# Patient Record
Sex: Female | Born: 1935 | Smoking: Never smoker
Health system: Southern US, Community
[De-identification: ages and names within clinical notes are randomized; demographics above are authoritative.]

## PROBLEM LIST (undated history)

## (undated) DIAGNOSIS — M1990 Unspecified osteoarthritis, unspecified site: Secondary | ICD-10-CM

## (undated) DIAGNOSIS — I10 Essential (primary) hypertension: Secondary | ICD-10-CM

---

## 1997-10-24 ENCOUNTER — Ambulatory Visit (HOSPITAL_COMMUNITY): Admission: RE | Admit: 1997-10-24 | Discharge: 1997-10-24 | Payer: Self-pay | Admitting: Family Medicine

## 1997-11-02 ENCOUNTER — Ambulatory Visit (HOSPITAL_COMMUNITY): Admission: RE | Admit: 1997-11-02 | Discharge: 1997-11-02 | Payer: Self-pay | Admitting: Family Medicine

## 2001-06-27 ENCOUNTER — Encounter: Payer: Self-pay | Admitting: Internal Medicine

## 2001-06-27 ENCOUNTER — Encounter: Admission: RE | Admit: 2001-06-27 | Discharge: 2001-06-27 | Payer: Self-pay | Admitting: Internal Medicine

## 2002-06-15 ENCOUNTER — Encounter: Payer: Self-pay | Admitting: Internal Medicine

## 2002-06-15 ENCOUNTER — Encounter: Admission: RE | Admit: 2002-06-15 | Discharge: 2002-06-15 | Payer: Self-pay | Admitting: Internal Medicine

## 2003-10-30 ENCOUNTER — Encounter: Admission: RE | Admit: 2003-10-30 | Discharge: 2003-10-30 | Payer: Self-pay | Admitting: Internal Medicine

## 2017-01-30 ENCOUNTER — Encounter (HOSPITAL_COMMUNITY): Payer: Self-pay

## 2017-01-30 ENCOUNTER — Inpatient Hospital Stay (HOSPITAL_COMMUNITY)
Admission: EM | Admit: 2017-01-30 | Discharge: 2017-02-01 | DRG: 563 | Disposition: A | Discharge status: Skilled Nursing Facility | Payer: Medicare Other | Attending: Internal Medicine | Admitting: Internal Medicine

## 2017-01-30 ENCOUNTER — Emergency Department (HOSPITAL_COMMUNITY): Payer: Medicare Other

## 2017-01-30 DIAGNOSIS — W182XXA Fall in (into) shower or empty bathtub, initial encounter: Secondary | ICD-10-CM | POA: Diagnosis present

## 2017-01-30 DIAGNOSIS — E872 Acidosis, unspecified: Secondary | ICD-10-CM

## 2017-01-30 DIAGNOSIS — S82101A Unspecified fracture of upper end of right tibia, initial encounter for closed fracture: Secondary | ICD-10-CM | POA: Diagnosis not present

## 2017-01-30 DIAGNOSIS — Y92031 Bathroom in apartment as the place of occurrence of the external cause: Secondary | ICD-10-CM

## 2017-01-30 DIAGNOSIS — S82192A Other fracture of upper end of left tibia, initial encounter for closed fracture: Secondary | ICD-10-CM

## 2017-01-30 DIAGNOSIS — N179 Acute kidney failure, unspecified: Secondary | ICD-10-CM | POA: Diagnosis not present

## 2017-01-30 DIAGNOSIS — S82209A Unspecified fracture of shaft of unspecified tibia, initial encounter for closed fracture: Secondary | ICD-10-CM | POA: Diagnosis present

## 2017-01-30 DIAGNOSIS — S8991XA Unspecified injury of right lower leg, initial encounter: Secondary | ICD-10-CM | POA: Diagnosis not present

## 2017-01-30 DIAGNOSIS — I10 Essential (primary) hypertension: Secondary | ICD-10-CM | POA: Diagnosis present

## 2017-01-30 DIAGNOSIS — Z8249 Family history of ischemic heart disease and other diseases of the circulatory system: Secondary | ICD-10-CM

## 2017-01-30 DIAGNOSIS — Y93E1 Activity, personal bathing and showering: Secondary | ICD-10-CM

## 2017-01-30 DIAGNOSIS — S82102A Unspecified fracture of upper end of left tibia, initial encounter for closed fracture: Secondary | ICD-10-CM | POA: Diagnosis not present

## 2017-01-30 DIAGNOSIS — Z66 Do not resuscitate: Secondary | ICD-10-CM | POA: Diagnosis present

## 2017-01-30 HISTORY — DX: Essential (primary) hypertension: I10

## 2017-01-30 LAB — CBC
HCT: 27.4 % — ABNORMAL LOW (ref 36.0–46.0)
Hemoglobin: 9.4 g/dL — ABNORMAL LOW (ref 12.0–15.0)
MCH: 29.1 pg (ref 26.0–34.0)
MCHC: 34.3 g/dL (ref 30.0–36.0)
MCV: 84.8 fL (ref 78.0–100.0)
PLATELETS: 143 10*3/uL — AB (ref 150–400)
RBC: 3.23 MIL/uL — ABNORMAL LOW (ref 3.87–5.11)
RDW: 16.1 % — AB (ref 11.5–15.5)
WBC: 4.5 10*3/uL (ref 4.0–10.5)

## 2017-01-30 LAB — CBC WITH DIFFERENTIAL/PLATELET
BASOS PCT: 0 %
Basophils Absolute: 0 10*3/uL (ref 0.0–0.1)
EOS ABS: 0 10*3/uL (ref 0.0–0.7)
Eosinophils Relative: 0 %
HCT: 26.3 % — ABNORMAL LOW (ref 36.0–46.0)
HEMOGLOBIN: 8.8 g/dL — AB (ref 12.0–15.0)
Lymphocytes Relative: 9 %
Lymphs Abs: 0.5 10*3/uL — ABNORMAL LOW (ref 0.7–4.0)
MCH: 28.6 pg (ref 26.0–34.0)
MCHC: 33.5 g/dL (ref 30.0–36.0)
MCV: 85.4 fL (ref 78.0–100.0)
MONOS PCT: 6 %
Monocytes Absolute: 0.3 10*3/uL (ref 0.1–1.0)
NEUTROS PCT: 85 %
Neutro Abs: 4.6 10*3/uL (ref 1.7–7.7)
Platelets: 146 10*3/uL — ABNORMAL LOW (ref 150–400)
RBC: 3.08 MIL/uL — AB (ref 3.87–5.11)
RDW: 16.2 % — ABNORMAL HIGH (ref 11.5–15.5)
WBC: 5.5 10*3/uL (ref 4.0–10.5)

## 2017-01-30 LAB — BASIC METABOLIC PANEL
Anion gap: 11 (ref 5–15)
BUN: 39 mg/dL — AB (ref 6–20)
CO2: 17 mmol/L — ABNORMAL LOW (ref 22–32)
CREATININE: 1.42 mg/dL — AB (ref 0.44–1.00)
Calcium: 8.7 mg/dL — ABNORMAL LOW (ref 8.9–10.3)
Chloride: 108 mmol/L (ref 101–111)
GFR, EST AFRICAN AMERICAN: 35 mL/min — AB (ref >60)
GFR, EST NON AFRICAN AMERICAN: 30 mL/min — AB (ref >60)
Glucose, Bld: 138 mg/dL — ABNORMAL HIGH (ref 65–99)
Potassium: 3.8 mmol/L (ref 3.5–5.1)
SODIUM: 136 mmol/L (ref 135–145)

## 2017-01-30 LAB — CREATININE, SERUM
Creatinine, Ser: 1.5 mg/dL — ABNORMAL HIGH (ref 0.44–1.00)
GFR, EST AFRICAN AMERICAN: 33 mL/min — AB (ref >60)
GFR, EST NON AFRICAN AMERICAN: 28 mL/min — AB (ref >60)

## 2017-01-30 MED ORDER — ACETAMINOPHEN 325 MG PO TABS
650.0000 mg | ORAL_TABLET | Freq: Four times a day (QID) | ORAL | Status: DC | PRN
  Administered 2017-01-31: 650 mg via ORAL
  Filled 2017-01-30: qty 2

## 2017-01-30 MED ORDER — ONDANSETRON HCL 4 MG/2ML IJ SOLN
4.0000 mg | Freq: Four times a day (QID) | INTRAMUSCULAR | Status: DC | PRN
Start: 2017-01-30 — End: 2017-02-01

## 2017-01-30 MED ORDER — ONDANSETRON HCL 4 MG PO TABS: 4.0000 mg | ORAL_TABLET | Freq: Four times a day (QID) | ORAL | Status: DC | PRN

## 2017-01-30 MED ORDER — HYDROCODONE-ACETAMINOPHEN 5-325 MG PO TABS
1.0000 | ORAL_TABLET | ORAL | Status: DC | PRN
  Administered 2017-01-30: 21:00:00 1 via ORAL
  Administered 2017-01-31 (×2): 2 via ORAL
  Filled 2017-01-30: qty 2
  Filled 2017-01-30: qty 1
  Filled 2017-01-30: qty 2

## 2017-01-30 MED ORDER — ACETAMINOPHEN 650 MG RE SUPP: 650.0000 mg | Freq: Four times a day (QID) | RECTAL | Status: DC | PRN

## 2017-01-30 MED ORDER — KETOROLAC TROMETHAMINE 15 MG/ML IJ SOLN
15.0000 mg | Freq: Four times a day (QID) | INTRAMUSCULAR | Status: DC | PRN
  Administered 2017-01-31: 15 mg via INTRAVENOUS
  Filled 2017-01-30: qty 1

## 2017-01-30 MED ORDER — SODIUM CHLORIDE 0.9 % IV SOLN
INTRAVENOUS | Status: DC
  Administered 2017-01-30: 17:00:00 via INTRAVENOUS

## 2017-01-30 MED ORDER — SODIUM CHLORIDE 0.9 % IV SOLN
INTRAVENOUS | Status: AC
  Administered 2017-01-30 – 2017-01-31 (×2): via INTRAVENOUS

## 2017-01-30 MED ORDER — POLYETHYLENE GLYCOL 3350 17 G PO PACK
17.0000 g | PACK | Freq: Every day | ORAL | Status: DC
  Administered 2017-01-31: 17 g via ORAL
  Filled 2017-01-30: qty 1

## 2017-01-30 MED ORDER — HEPARIN SODIUM (PORCINE) 5000 UNIT/ML IJ SOLN
5000.0000 [IU] | Freq: Three times a day (TID) | INTRAMUSCULAR | Status: DC
  Administered 2017-01-30 – 2017-02-01 (×6): 5000 [IU] via SUBCUTANEOUS
  Filled 2017-01-30 (×6): qty 1

## 2017-01-30 NOTE — H&P (Signed)
History and Physical    Sharon Ryan:[id number] DOB: [birth year] DOA: 01/30/2017  PCP: Ralene Ok, MD  Patient coming from: Home  I have personally briefly reviewed patient's old medical records in [facility]  Chief Complaint: Mechanical fall with right knee pain  HPI: Sharon Ryan is a 83 y.o. female with medical history significant of no significant past medical history lives alone for the last several years that comes in after mechanical fall that happened when she was getting into the bathtub. She relates she did not lose consciousness, no blurry vision no dizziness, no chest pain, no shortness of breath, no diarrhea, no fever.  ED Course:  She was found to have a creatinine of 1.4 bicarbonate of 17 hemoglobin of 8.8 with a platelet count of 146 and an MCV of 85 were consulted for further evaluation and possible skilled nursing facility placement.  Review of Systems: As per HPI otherwise 10 point review of systems negative.    Past Medical History:  Diagnosis Date  . Hypertension     History reviewed. No pertinent surgical history.   does not have a smoking history on file. She has never used smokeless tobacco. She reports that she does not drink alcohol or use drugs.  Not on File  Family History  Problem Relation Age of Onset  . Heart attack Father      Prior to Admission medications   Medication Sig Start Date End Date Taking? Authorizing Provider  PRESCRIPTION MEDICATION Take 1 tablet by mouth daily.   Yes [provider]    Physical Exam: Vitals:   01/30/17 1413  BP: (!) 157/76  Pulse: 93  Resp: 16  Temp: 97.9 F (36.6 C)  TempSrc: Oral  SpO2: 95%    Constitutional: NAD, calm, comfortable Vitals:   01/30/17 1413  BP: (!) 157/76  Pulse: 93  Resp: 16  Temp: 97.9 F (36.6 C)  TempSrc: Oral  SpO2: 95%   Eyes: PERRL, lids and conjunctivae normal ENMT: Mucous membranes are moist. Posterior pharynx clear of any  exudate or lesions.Normal dentition.  Neck: normal, supple, no masses, no thyromegaly Respiratory: clear to auscultation bilaterally, no wheezing, no crackles. Normal respiratory effort. No accessory muscle use.  Cardiovascular: Regular rate and rhythm, no murmurs / rubs / gallops. No extremity edema. 2+ pedal pulses. No carotid bruits.  Abdomen: no tenderness, no masses palpated. No hepatosplenomegaly. Bowel sounds positive.  Musculoskeletal: no clubbing / cyanosis. No joint deformity upper and lower extremities. Good ROM, no contractures. Normal muscle tone.  Skin: no rashes, lesions, ulcers. No induration Neurologic: CN 2-12 grossly intact. Sensation intact, DTR normal. Strength 5/5 in all 4.  Psychiatric: Normal judgment and insight. Alert and oriented x 3. Normal mood.     Labs on Admission: I have personally reviewed following labs and imaging studies  CBC:  Recent Labs Lab 01/30/17 1540  WBC 5.5  NEUTROABS 4.6  HGB 8.8*  HCT 26.3*  MCV 85.4  PLT 146*   Basic Metabolic Panel:  Recent Labs Lab 01/30/17 1540  NA 136  K 3.8  CL 108  CO2 17*  GLUCOSE 138*  BUN 39*  CREATININE 1.42*  CALCIUM 8.7*   GFR: CrCl cannot be calculated (Unknown ideal weight.). Liver Function Tests: No results for input(s): AST, ALT, ALKPHOS, BILITOT, PROT, ALBUMIN in the last 168 hours. No results for input(s): LIPASE, AMYLASE in the last 168 hours. No results for input(s): AMMONIA in the last 168 hours. Coagulation Profile: No results  for input(s): INR, PROTIME in the last 168 hours. Cardiac Enzymes: No results for input(s): CKTOTAL, CKMB, CKMBINDEX, TROPONINI in the last 168 hours. BNP (last 3 results) No results for input(s): PROBNP in the last 8760 hours. HbA1C: No results for input(s): HGBA1C in the last 72 hours. CBG: No results for input(s): GLUCAP in the last 168 hours. Lipid Profile: No results for input(s): CHOL, HDL, LDLCALC, TRIG, CHOLHDL, LDLDIRECT in the last 72  hours. Thyroid Function Tests: No results for input(s): TSH, T4TOTAL, FREET4, T3FREE, THYROIDAB in the last 72 hours. Anemia Panel: No results for input(s): VITAMINB12, FOLATE, FERRITIN, TIBC, IRON, RETICCTPCT in the last 72 hours. Urine analysis: No results found for: COLORURINE, APPEARANCEUR, LABSPEC, PHURINE, GLUCOSEU, HGBUR, BILIRUBINUR, KETONESUR, PROTEINUR, UROBILINOGEN, NITRITE, LEUKOCYTESUR  Radiological Exams on Admission: Dg Tibia/fibula Right  Result Date: 01/30/2017 CLINICAL DATA:  Fall in shower today with right lower leg pain. EXAM: RIGHT TIBIA AND FIBULA - 2 VIEW COMPARISON:  None. FINDINGS: Diffuse osteopenia. Evidence of patient's minimally displaced transverse fracture of the proximal tibial metaphysis. Degenerative changes over the knee. Knee joint effusion. Mild degenerative change over the ankle. IMPRESSION: Minimally displaced transverse fracture of the proximal tibial metaphysis. Degenerative changes of the knee and ankle.  Knee joint effusion. Electronically Signed   By: Elberta Fortisaniel  Boyle M.D.   On: 01/30/2017 15:34   Dg Knee Complete 4 Views Right  Result Date: 01/30/2017 CLINICAL DATA:  Fall in shower today with right knee pain as well as lower leg pain. EXAM: RIGHT KNEE - COMPLETE 4+ VIEW COMPARISON:  None. FINDINGS: Exam demonstrates mild tricompartmental osteoarthritic change. There is diffuse osteopenia. There is a minimally displaced transverse fracture of the proximal tibial metaphysis. Moderate-sized joint effusion. IMPRESSION: Minimally displaced transverse fracture of the proximal tibial metaphysis. Moderate joint effusion. Mild tricompartmental osteoarthritis. Electronically Signed   By: Elberta Fortisaniel  Boyle M.D.   On: 01/30/2017 15:32    EKG: Independently reviewed. none  Assessment/Plan Active Problems:   Closed fracture of left proximal tibia   AKI (acute kidney injury) (HCC)   Normal anion gap metabolic acidosis As per patient she takes no medication. The ED  physician has already talked to Dr. Vallery SaSwyntek which will see her tomorrow morning, he recommended nonweightbearing in a knee brace. We'll admitted to the hospital control her pain, with narcotics. Use MiraLAX for constipation. We'll get physical therapy. Unknown baseline creatinine will start her on gentle IV fluid hydration and recheck a basic metabolic panel in the morning. She denies any diarrhea or any vomiting, unclear etiology of her non-anion gap metabolic acidosis will hydrate her with IV fluids and recheck tomorrow morning.   DVT prophylaxis: Lovenox Code Status: DNR Family Communication: none, son lives in [place] Disposition Plan:  Consults called: Orthopedic  Admission status: observation   Marinda ElkFELIZ ORTIZ, ABRAHAM MD [facility] Pager [phone]  If 7PM-7AM, please contact night-coverage [email or web address] Password [email or web address]  01/30/2017, 6:24 PM

## 2017-01-30 NOTE — ED Notes (Signed)
. ED TO INPATIENT HANDOFF REPORT  Name/Age/Gender Sharon Ryan 83 y.o. female  Code Status Code Status History    This patient does not have a recorded code status. Please follow your organizational policy for patients in this situation.      Home/SNF/Other    Chief Complaint Fall  Level of Care/Admitting Diagnosis ED Disposition    ED Disposition Condition Comment   Admit  Hospital Area: [facility] [[id number]]  Level of Care: Med-Surg [16]  Diagnosis: Tibial fracture [270350]  Admitting Physician: Charlynne Cousins [[id number]]  Attending Physician: Charlynne Cousins [[id number]]  PT Class (Do Not Modify): Observation [104]  PT Acc Code (Do Not Modify): Observation [10022]       Medical History Past Medical History:  Diagnosis Date  . Hypertension     Allergies Not on File  IV Location/Drains/Wounds Patient Lines/Drains/Airways Status   Active Line/Drains/Airways    Name:   Placement date:   Placement time:   Site:   Days:   Peripheral IV 01/30/17 Left Forearm  01/30/17    1647    Forearm    less than 1          Labs/Imaging Results for orders placed or performed during the hospital encounter of 01/30/17 (from the past 48 hour(s))  CBC with Differential/Platelet     Status: Abnormal   Collection Time: 01/30/17  3:40 PM  Result Value Ref Range   WBC 5.5 4.0 - 10.5 K/uL   RBC 3.08 (L) 3.87 - 5.11 MIL/uL   Hemoglobin 8.8 (L) 12.0 - 15.0 g/dL   HCT 26.3 (L) 36.0 - 46.0 %   MCV 85.4 78.0 - 100.0 fL   MCH 28.6 26.0 - 34.0 pg   MCHC 33.5 30.0 - 36.0 g/dL   RDW 16.2 (H) 11.5 - 15.5 %   Platelets 146 (L) 150 - 400 K/uL   Neutrophils Relative % 85 %   Neutro Abs 4.6 1.7 - 7.7 K/uL   Lymphocytes Relative 9 %   Lymphs Abs 0.5 (L) 0.7 - 4.0 K/uL   Monocytes Relative 6 %   Monocytes Absolute 0.3 0.1 - 1.0 K/uL   Eosinophils Relative 0 %   Eosinophils Absolute 0.0 0.0 - 0.7 K/uL   Basophils Relative 0 %   Basophils Absolute 0.0 0.0 - 0.1  K/uL  Basic metabolic panel     Status: Abnormal   Collection Time: 01/30/17  3:40 PM  Result Value Ref Range   Sodium 136 135 - 145 mmol/L   Potassium 3.8 3.5 - 5.1 mmol/L   Chloride 108 101 - 111 mmol/L   CO2 17 (L) 22 - 32 mmol/L   Glucose, Bld 138 (H) 65 - 99 mg/dL   BUN 39 (H) 6 - 20 mg/dL   Creatinine, Ser 1.42 (H) 0.44 - 1.00 mg/dL   Calcium 8.7 (L) 8.9 - 10.3 mg/dL   GFR calc non Af Amer 30 (L) >60 mL/min   GFR calc Af Amer 35 (L) >60 mL/min    Comment: (NOTE) The eGFR has been calculated using the CKD EPI equation. This calculation has not been validated in all clinical situations. eGFR's persistently <60 mL/min signify possible Chronic Kidney Disease.    Anion gap 11 5 - 15   Dg Tibia/fibula Right  Result Date: 01/30/2017 CLINICAL DATA:  Fall in shower today with right lower leg pain. EXAM: RIGHT TIBIA AND FIBULA - 2 VIEW COMPARISON:  None. FINDINGS: Diffuse osteopenia. Evidence of patient's minimally displaced  transverse fracture of the proximal tibial metaphysis. Degenerative changes over the knee. Knee joint effusion. Mild degenerative change over the ankle. IMPRESSION: Minimally displaced transverse fracture of the proximal tibial metaphysis. Degenerative changes of the knee and ankle.  Knee joint effusion. Electronically Signed   By: Marin Olp M.D.   On: 01/30/2017 15:34   Dg Knee Complete 4 Views Right  Result Date: 01/30/2017 CLINICAL DATA:  Fall in shower today with right knee pain as well as lower leg pain. EXAM: RIGHT KNEE - COMPLETE 4+ VIEW COMPARISON:  None. FINDINGS: Exam demonstrates mild tricompartmental osteoarthritic change. There is diffuse osteopenia. There is a minimally displaced transverse fracture of the proximal tibial metaphysis. Moderate-sized joint effusion. IMPRESSION: Minimally displaced transverse fracture of the proximal tibial metaphysis. Moderate joint effusion. Mild tricompartmental osteoarthritis. Electronically Signed   By: Marin Olp  M.D.   On: 01/30/2017 15:32    Pending Labs [employer]    Start     Ordered   Signed and Held  CBC  (heparin)  Once,   R    Comments:  Baseline for heparin therapy IF NOT ALREADY DRAWN.  Notify MD if PLT < 100 K.    Signed and Held   Signed and Held  Creatinine, serum  (heparin)  Once,   R    Comments:  Baseline for heparin therapy IF NOT ALREADY DRAWN.    Signed and Held      Vitals/Pain Today's Vitals   01/30/17 1413 01/30/17 1415 01/30/17 1831  BP: (!) 157/76  (!) 145/87  Pulse: 93  65  Resp: 16  18  Temp: 97.9 F (36.6 C)  98.2 F (36.8 C)  TempSrc: Oral  Oral  SpO2: 95%  94%  PainSc:  2      Isolation Precautions No active isolations  Medications Medications  0.9 %  sodium chloride infusion ( Intravenous New Bag/Given 01/30/17 1700)    [place]

## 2017-01-30 NOTE — ED Provider Notes (Signed)
[facility]-EMERGENCY DEPT Provider Note   CSN: [id number] Arrival date & time: 01/30/17  1356     History   Chief Complaint Chief Complaint  Patient presents with  . Fall  . Knee Pain    HPI Sharon Ryan is a 83 y.o. female.  83 year old female who had a mechanical fall just prior to arrival where she slipped and fall when getting out of the shower.  Denies any head injury.  No neck back chest or abdominal discomfort.  Complains of dull pain to her right knee which is worse when she tries to stand.  She was not able to walk after she fell.  Denies any hip pain.  Pain better with remaining still.  Was on the floor for approximately 4-5 hours.  Denies any weakness.  Called EMS and was transported here      Past Medical History:  Diagnosis Date  . Hypertension     There are no active problems to display for this patient.   History reviewed. No pertinent surgical history.  OB History    No data available       Home Medications    Prior to Admission medications   Not on File    Family History No family history on file.  Social History Social History  Substance Use Topics  . Smoking status: Not on file  . Smokeless tobacco: Never Used  . Alcohol use No     Allergies   Patient has no allergy information on record.   Review of Systems Review of Systems  All other systems reviewed and are negative.    Physical Exam Updated Vital Signs BP (!) 157/76 (BP Location: Right Arm)   Pulse 93   Temp 97.9 F (36.6 C) (Oral)   Resp 16   SpO2 95%   Physical Exam  Constitutional: She is oriented to person, place, and time. She appears well-developed and well-nourished.  Non-toxic appearance. No distress.  HENT:  Head: Normocephalic and atraumatic.  Eyes: Pupils are equal, round, and reactive to light. Conjunctivae, EOM and lids are normal.  Neck: Normal range of motion. Neck supple. No tracheal deviation present. No thyroid  mass present.  Cardiovascular: Normal rate, regular rhythm and normal heart sounds.  Exam reveals no gallop.   No murmur heard. Pulmonary/Chest: Effort normal and breath sounds normal. No stridor. No respiratory distress. She has no decreased breath sounds. She has no wheezes. She has no rhonchi. She has no rales.  Abdominal: Soft. Normal appearance and bowel sounds are normal. She exhibits no distension. There is no tenderness. There is no rebound and no CVA tenderness.  Musculoskeletal: She exhibits no edema or tenderness.       Right knee: She exhibits decreased range of motion, swelling, effusion and ecchymosis.       Legs: Neurological: She is alert and oriented to person, place, and time. She has normal strength. No cranial nerve deficit or sensory deficit. GCS eye subscore is 4. GCS verbal subscore is 5. GCS motor subscore is 6.  Skin: Skin is warm and dry. No abrasion and no rash noted.  Psychiatric: She has a normal mood and affect. Her speech is normal and behavior is normal.  Nursing note and vitals reviewed.    ED Treatments / Results  Labs (all labs ordered are listed, but only abnormal results are displayed) Labs Reviewed - No data to display  EKG  EKG Interpretation None       Radiology No  results found.  Procedures Procedures (including critical care time)  Medications Ordered in ED Medications - No data to display   Initial Impression / Assessment and Plan / ED Course  I have reviewed the triage vital signs and the nursing notes.  Pertinent labs & imaging results that were available during my care of the patient were reviewed by me and considered in my medical decision making (see chart for details).     Discussed with Dr. Veda Canning and he recommends nonweightbearing and knee immobilizer Patient states that she has limited resources at home.  Labs are pending and care signed out to Dr. Anitra Lauth for patient likely to be admitted  Final Clinical  Impressions(s) / ED Diagnoses   Final diagnoses:  None    New Prescriptions New Prescriptions   No medications on file     Sharon Nick, MD 01/30/17 1619

## 2017-01-30 NOTE — ED Notes (Signed)
X-ray AT BEDSIDE 

## 2017-01-30 NOTE — ED Notes (Signed)
ADMISSION MD PRESENT SPEAKING WITH PT. AWARE OF ADMISSION

## 2017-01-30 NOTE — ED Notes (Signed)
Bed: [id number] Expected date:  Expected time:  Means of arrival:  Comments: EMS

## 2017-01-30 NOTE — ED Notes (Signed)
ED Provider at bedside. 

## 2017-01-30 NOTE — ED Triage Notes (Signed)
Per [facility]- Pt resides at home by herself. Pt states she slipped and fell coming out of bathtub. Denies LOC, neck and back pain. Pt c/o of rt knee/leg pain. No deformity present. Pulses present. No other complaints. Pt states she was on the floor for 5 hours

## 2017-01-31 ENCOUNTER — Encounter (HOSPITAL_COMMUNITY): Payer: Self-pay | Admitting: *Deleted

## 2017-01-31 ENCOUNTER — Ambulatory Visit: Payer: Self-pay | Admitting: Orthopedic Surgery

## 2017-01-31 DIAGNOSIS — Y92031 Bathroom in apartment as the place of occurrence of the external cause: Secondary | ICD-10-CM | POA: Diagnosis not present

## 2017-01-31 DIAGNOSIS — S82192A Other fracture of upper end of left tibia, initial encounter for closed fracture: Secondary | ICD-10-CM

## 2017-01-31 DIAGNOSIS — S82102A Unspecified fracture of upper end of left tibia, initial encounter for closed fracture: Secondary | ICD-10-CM | POA: Diagnosis not present

## 2017-01-31 DIAGNOSIS — S82101A Unspecified fracture of upper end of right tibia, initial encounter for closed fracture: Secondary | ICD-10-CM | POA: Diagnosis present

## 2017-01-31 DIAGNOSIS — I10 Essential (primary) hypertension: Secondary | ICD-10-CM | POA: Diagnosis present

## 2017-01-31 DIAGNOSIS — N179 Acute kidney failure, unspecified: Secondary | ICD-10-CM | POA: Diagnosis present

## 2017-01-31 DIAGNOSIS — S8991XA Unspecified injury of right lower leg, initial encounter: Secondary | ICD-10-CM | POA: Diagnosis present

## 2017-01-31 DIAGNOSIS — Z66 Do not resuscitate: Secondary | ICD-10-CM | POA: Diagnosis present

## 2017-01-31 DIAGNOSIS — Y93E1 Activity, personal bathing and showering: Secondary | ICD-10-CM | POA: Diagnosis not present

## 2017-01-31 DIAGNOSIS — E872 Acidosis: Secondary | ICD-10-CM | POA: Diagnosis not present

## 2017-01-31 DIAGNOSIS — W182XXA Fall in (into) shower or empty bathtub, initial encounter: Secondary | ICD-10-CM | POA: Diagnosis present

## 2017-01-31 DIAGNOSIS — Z8249 Family history of ischemic heart disease and other diseases of the circulatory system: Secondary | ICD-10-CM | POA: Diagnosis not present

## 2017-01-31 MED ORDER — POLYETHYLENE GLYCOL 3350 17 G PO PACK
17.0000 g | PACK | Freq: Every day | ORAL | Status: DC
  Filled 2017-01-31: qty 1

## 2017-01-31 MED ORDER — CHLORTHALIDONE 25 MG PO TABS: 25.0000 mg | ORAL_TABLET | Freq: Every day | ORAL | Status: DC

## 2017-01-31 MED ORDER — AMLODIPINE BESYLATE 5 MG PO TABS
5.0000 mg | ORAL_TABLET | Freq: Every day | ORAL | Status: DC
  Administered 2017-01-31 – 2017-02-01 (×2): 5 mg via ORAL
  Filled 2017-01-31 (×2): qty 1

## 2017-01-31 MED ORDER — OXYCODONE HCL 5 MG PO TABS
5.0000 mg | ORAL_TABLET | ORAL | Status: DC | PRN
  Administered 2017-01-31 – 2017-02-01 (×5): 5 mg via ORAL
  Filled 2017-01-31 (×5): qty 1

## 2017-01-31 NOTE — Consult Note (Signed)
Reason for Consult:tibial Fracture Referring Physician: Dr.Feliz  Sharon Ryan is an 83 y.o. female.  HPI: Patient was consulted inpatient for non displaced proximal tibia fracture. She came to [facility] following a fall trying to get into the bath tub. The fall occurred 01/30/17. She lives alone. No syncope, CP, or SOB reported at the time of fall. No other trauma reported.  Past Medical History:  Diagnosis Date  . Hypertension     History reviewed. No pertinent surgical history.  Family History  Problem Relation Age of Onset  . Heart attack Father     Social History:  reports that she has never smoked. She has never used smokeless tobacco. She reports that she does not drink alcohol or use drugs.  Allergies: No Known Allergies  Medications: I have reviewed the patient's current medications.  Results for orders placed or performed during the hospital encounter of 01/30/17 (from the past 48 hour(s))  CBC with Differential/Platelet     Status: Abnormal   Collection Time: 01/30/17  3:40 PM  Result Value Ref Range   WBC 5.5 4.0 - 10.5 K/uL   RBC 3.08 (L) 3.87 - 5.11 MIL/uL   Hemoglobin 8.8 (L) 12.0 - 15.0 g/dL   HCT 26.3 (L) 36.0 - 46.0 %   MCV 85.4 78.0 - 100.0 fL   MCH 28.6 26.0 - 34.0 pg   MCHC 33.5 30.0 - 36.0 g/dL   RDW 16.2 (H) 11.5 - 15.5 %   Platelets 146 (L) 150 - 400 K/uL   Neutrophils Relative % 85 %   Neutro Abs 4.6 1.7 - 7.7 K/uL   Lymphocytes Relative 9 %   Lymphs Abs 0.5 (L) 0.7 - 4.0 K/uL   Monocytes Relative 6 %   Monocytes Absolute 0.3 0.1 - 1.0 K/uL   Eosinophils Relative 0 %   Eosinophils Absolute 0.0 0.0 - 0.7 K/uL   Basophils Relative 0 %   Basophils Absolute 0.0 0.0 - 0.1 K/uL  Basic metabolic panel     Status: Abnormal   Collection Time: 01/30/17  3:40 PM  Result Value Ref Range   Sodium 136 135 - 145 mmol/L   Potassium 3.8 3.5 - 5.1 mmol/L   Chloride 108 101 - 111 mmol/L   CO2 17 (L) 22 - 32 mmol/L   Glucose, Bld 138 (H) 65 - 99 mg/dL    BUN 39 (H) 6 - 20 mg/dL   Creatinine, Ser 1.42 (H) 0.44 - 1.00 mg/dL   Calcium 8.7 (L) 8.9 - 10.3 mg/dL   GFR calc non Af Amer 30 (L) >60 mL/min   GFR calc Af Amer 35 (L) >60 mL/min    Comment: (NOTE) The eGFR has been calculated using the CKD EPI equation. This calculation has not been validated in all clinical situations. eGFR's persistently <60 mL/min signify possible Chronic Kidney Disease.    Anion gap 11 5 - 15  CBC     Status: Abnormal   Collection Time: 01/30/17  9:25 PM  Result Value Ref Range   WBC 4.5 4.0 - 10.5 K/uL   RBC 3.23 (L) 3.87 - 5.11 MIL/uL   Hemoglobin 9.4 (L) 12.0 - 15.0 g/dL   HCT 27.4 (L) 36.0 - 46.0 %   MCV 84.8 78.0 - 100.0 fL   MCH 29.1 26.0 - 34.0 pg   MCHC 34.3 30.0 - 36.0 g/dL   RDW 16.1 (H) 11.5 - 15.5 %   Platelets 143 (L) 150 - 400 K/uL  Creatinine, serum  Status: Abnormal   Collection Time: 01/30/17  9:25 PM  Result Value Ref Range   Creatinine, Ser 1.50 (H) 0.44 - 1.00 mg/dL   GFR calc non Af Amer 28 (L) >60 mL/min   GFR calc Af Amer 33 (L) >60 mL/min    Comment: (NOTE) The eGFR has been calculated using the CKD EPI equation. This calculation has not been validated in all clinical situations. eGFR's persistently <60 mL/min signify possible Chronic Kidney Disease.     Dg Tibia/fibula Right  Result Date: 01/30/2017 CLINICAL DATA:  Fall in shower today with right lower leg pain. EXAM: RIGHT TIBIA AND FIBULA - 2 VIEW COMPARISON:  None. FINDINGS: Diffuse osteopenia. Evidence of patient's minimally displaced transverse fracture of the proximal tibial metaphysis. Degenerative changes over the knee. Knee joint effusion. Mild degenerative change over the ankle. IMPRESSION: Minimally displaced transverse fracture of the proximal tibial metaphysis. Degenerative changes of the knee and ankle.  Knee joint effusion. Electronically Signed   By: Marin Olp M.D.   On: 01/30/2017 15:34   Dg Knee Complete 4 Views Right  Result Date:  01/30/2017 CLINICAL DATA:  Fall in shower today with right knee pain as well as lower leg pain. EXAM: RIGHT KNEE - COMPLETE 4+ VIEW COMPARISON:  None. FINDINGS: Exam demonstrates mild tricompartmental osteoarthritic change. There is diffuse osteopenia. There is a minimally displaced transverse fracture of the proximal tibial metaphysis. Moderate-sized joint effusion. IMPRESSION: Minimally displaced transverse fracture of the proximal tibial metaphysis. Moderate joint effusion. Mild tricompartmental osteoarthritis. Electronically Signed   By: Marin Olp M.D.   On: 01/30/2017 15:32    Review of Systems  Constitutional: Negative.   HENT: Negative.   Eyes: Negative.   Respiratory: Negative.   Cardiovascular: Negative.   Gastrointestinal: Negative.   Genitourinary: Negative.   Musculoskeletal: Positive for falls and joint pain.  Skin: Negative.   Neurological: Negative.   Endo/Heme/Allergies: Negative.    Blood pressure 130/78, pulse 79, temperature 98.2 F (36.8 C), temperature source Oral, resp. rate 15, height '5\' 3"'$  (1.6 m), weight 58 kg (127 lb 13.9 oz), SpO2 94 %. Physical Exam  Constitutional: She is oriented to person, place, and time. She appears well-developed.  HENT:  Head: Normocephalic.  Eyes: EOM are normal.  Neck: Normal range of motion.  Cardiovascular: Normal rate and intact distal pulses.   Respiratory: Effort normal.  GI: Soft.  Genitourinary:  Genitourinary Comments: Deferred  Musculoskeletal:  Right lower leg ecchymosis. Tenderness on palpation at proximal tibial. 2+ pedal pulse. Compartments are soft. Plantar and dorsi flexion intact. Calf soft and nontender. Sensation intact to touch.  Neurological: She is alert and oriented to person, place, and time.  Skin: Skin is warm and dry.  Psychiatric: Her behavior is normal.    Assessment/Plan: Non displaced right proximal tibia fracture: NWB RLE Knee Immobilizer at all times Ankle Pumps  On Heparin  Pain  management Possible SNF at D/c Follow up with Dr.Namiyah Grantham 1 week after d/c. Admitted to medicine   STILWELL, BRYSON L 01/31/2017, 11:45 AM   Agree with above note.

## 2017-01-31 NOTE — Progress Notes (Signed)
[facility] PROGRESS NOTE    Progress Note  Sharon Ryan  GNF:[id number] DOB: 08-21-1918 DOA: 01/30/2017 PCP: Ralene Ok, MD     Brief Narrative:   Sharon Ryan is an 83 y.o. female past medical history significant social hypertension that comes in after a fall was found to have a proximal left tibial fracture  Assessment/Plan:   Active Problems:   Closed fracture of left proximal tibia   Normal anion gap metabolic acidosis   Tibial fracture  Spoke with Dr. Eli Phillips this morning he relates she need to follow-up x-ray at his office in a week he recommended a brace nonweightbearing. PT and OT have been consulted, will continue narcotics for pain control use MiraLAX for constipation. She relates her pain is not controlled, will change to OxyIR. Recheck a basic metabolic panel tomorrow morning, this seems to be her baseline creatinine.  DVT prophylaxis: lovenox Family Communication:none Disposition Plan/Barrier to D/C: SNF on 10.29.2018 Code Status:     Code Status Orders        Start     Ordered   01/30/17 2046  Do not attempt resuscitation (DNR)  Continuous    Question Answer Comment  In the event of cardiac or respiratory ARREST Do not call a "code blue"   In the event of cardiac or respiratory ARREST Do not perform Intubation, CPR, defibrillation or ACLS   In the event of cardiac or respiratory ARREST Use medication by any route, position, wound care, and other measures to relive pain and suffering. May use oxygen, suction and manual treatment of airway obstruction as needed for comfort.      01/30/17 2045    Code Status History    Date Active Date Inactive Code Status Order ID Comments User Context   This patient has a current code status but no historical code status.        IV Access:    Peripheral IV   Procedures and diagnostic studies:   Dg Tibia/fibula Right  Result Date: 01/30/2017 CLINICAL DATA:  Fall in shower today with right  lower leg pain. EXAM: RIGHT TIBIA AND FIBULA - 2 VIEW COMPARISON:  None. FINDINGS: Diffuse osteopenia. Evidence of patient's minimally displaced transverse fracture of the proximal tibial metaphysis. Degenerative changes over the knee. Knee joint effusion. Mild degenerative change over the ankle. IMPRESSION: Minimally displaced transverse fracture of the proximal tibial metaphysis. Degenerative changes of the knee and ankle.  Knee joint effusion. Electronically Signed   By: Elberta Fortis M.D.   On: 01/30/2017 15:34   Dg Knee Complete 4 Views Right  Result Date: 01/30/2017 CLINICAL DATA:  Fall in shower today with right knee pain as well as lower leg pain. EXAM: RIGHT KNEE - COMPLETE 4+ VIEW COMPARISON:  None. FINDINGS: Exam demonstrates mild tricompartmental osteoarthritic change. There is diffuse osteopenia. There is a minimally displaced transverse fracture of the proximal tibial metaphysis. Moderate-sized joint effusion. IMPRESSION: Minimally displaced transverse fracture of the proximal tibial metaphysis. Moderate joint effusion. Mild tricompartmental osteoarthritis. Electronically Signed   By: Elberta Fortis M.D.   On: 01/30/2017 15:32     Medical Consultants:    None.  Anti-Infectives:   None  Subjective:    Sharon Ryan she relates her pain is not controlled has not had a bowel movement.  Objective:    Vitals:   01/30/17 1901 01/30/17 2100 01/30/17 2158 01/31/17 0637  BP: (!) 147/81  (!) 148/64 130/78  Pulse: 78  98 79  Resp: 16  18 15  Temp: 98 F (36.7 C)  97.9 F (36.6 C) 98.2 F (36.8 C)  TempSrc: Oral  Oral Oral  SpO2: 100%  96% 94%  Weight:  58 kg (127 lb 13.9 oz)    Height:  5\' 3"  (1.6 m)      Intake/Output Summary (Last 24 hours) at 01/31/17 1137 Last data filed at 01/31/17 [id number]  Gross per 24 hour  Intake          1311.25 ml  Output              625 ml  Net           686.25 ml   Filed Weights   01/30/17 2100  Weight: 58 kg (127 lb 13.9 oz)     Exam: General exam: In no acute distress, happy mood. Respiratory system: Good air movement and clear to auscultation. Cardiovascular system: Regular rate and rhythm with positive S1-S2. Gastrointestinal system: Positive bowel sounds soft nontender nondistended Central nervous system: Alert and oriented. No focal neurological deficits. Extremities: No pedal edema. Skin: No rashes, lesions or ulcers Psychiatry: Judgement and insight appear normal. Mood & affect appropriate.    Data Reviewed:    Labs: Basic Metabolic Panel:  Recent Labs Lab 01/30/17 1540 01/30/17 2125  NA 136  --   K 3.8  --   CL 108  --   CO2 17*  --   GLUCOSE 138*  --   BUN 39*  --   CREATININE 1.42* 1.50*  CALCIUM 8.7*  --    GFR Estimated Creatinine Clearance: 18.1 mL/min (A) (by C-G formula based on SCr of 1.5 mg/dL (H)). Liver Function Tests: No results for input(s): AST, ALT, ALKPHOS, BILITOT, PROT, ALBUMIN in the last 168 hours. No results for input(s): LIPASE, AMYLASE in the last 168 hours. No results for input(s): AMMONIA in the last 168 hours. Coagulation profile No results for input(s): INR, PROTIME in the last 168 hours.  CBC:  Recent Labs Lab 01/30/17 1540 01/30/17 2125  WBC 5.5 4.5  NEUTROABS 4.6  --   HGB 8.8* 9.4*  HCT 26.3* 27.4*  MCV 85.4 84.8  PLT 146* 143*   Cardiac Enzymes: No results for input(s): CKTOTAL, CKMB, CKMBINDEX, TROPONINI in the last 168 hours. BNP (last 3 results) No results for input(s): PROBNP in the last 8760 hours. CBG: No results for input(s): GLUCAP in the last 168 hours. D-Dimer: No results for input(s): DDIMER in the last 72 hours. Hgb A1c: No results for input(s): HGBA1C in the last 72 hours. Lipid Profile: No results for input(s): CHOL, HDL, LDLCALC, TRIG, CHOLHDL, LDLDIRECT in the last 72 hours. Thyroid function studies: No results for input(s): TSH, T4TOTAL, T3FREE, THYROIDAB in the last 72 hours.  Invalid input(s): FREET3 Anemia  work up: No results for input(s): VITAMINB12, FOLATE, FERRITIN, TIBC, IRON, RETICCTPCT in the last 72 hours. Sepsis Labs:  Recent Labs Lab 01/30/17 1540 01/30/17 2125  WBC 5.5 4.5   Microbiology No results found for this or any previous visit (from the past 240 hour(s)).   Medications:   . heparin  5,000 Units Subcutaneous Q8H  . [START ON 02/01/2017] polyethylene glycol  17 g Oral Daily   Continuous Infusions: . sodium chloride 75 mL/hr at 01/30/17 2108       LOS: 1 day   Marinda ElkFELIZ ORTIZ, ABRAHAM  [facility] Pager [phone]  *Please refer to [email or web address], password [email or web address] to get updated schedule on who will round on this patient, as hospitalists  switch teams weekly. If 7PM-7AM, please contact night-coverage at [email or web address], password [email or web address] for any overnight needs.  01/31/2017, 11:37 AM

## 2017-02-01 LAB — BASIC METABOLIC PANEL
Anion gap: 9 (ref 5–15)
BUN: 32 mg/dL — ABNORMAL HIGH (ref 6–20)
CHLORIDE: 107 mmol/L (ref 101–111)
CO2: 21 mmol/L — AB (ref 22–32)
Calcium: 8.7 mg/dL — ABNORMAL LOW (ref 8.9–10.3)
Creatinine, Ser: 1.48 mg/dL — ABNORMAL HIGH (ref 0.44–1.00)
GFR calc Af Amer: 33 mL/min — ABNORMAL LOW (ref >60)
GFR calc non Af Amer: 29 mL/min — ABNORMAL LOW (ref >60)
GLUCOSE: 105 mg/dL — AB (ref 65–99)
POTASSIUM: 3.8 mmol/L (ref 3.5–5.1)
Sodium: 137 mmol/L (ref 135–145)

## 2017-02-01 MED ORDER — POLYETHYLENE GLYCOL 3350 17 G PO PACK: 17.0000 g | PACK | Freq: Every day | ORAL | 0 refills | Status: DC

## 2017-02-01 MED ORDER — OXYCODONE HCL 5 MG PO TABS: 5.0000 mg | ORAL_TABLET | ORAL | 0 refills | Status: DC | PRN

## 2017-02-01 NOTE — Progress Notes (Signed)
[facility]

## 2017-02-01 NOTE — [place] FL2 (Signed)
[place] MEDICAID FL2 LEVEL OF CARE SCREENING TOOL     IDENTIFICATION  Patient Name: Sharon Ryan Birthdate: 12-Feb-1919 Sex: female Admission Date (Current Location): 01/30/2017  [facility] and [place] Number:  Producer, television/film/video and Address:  [facility],  [address] [place], [place] [address]      Provider Number: [id number]  Attending Physician Name and Address:  Marinda Elk, MD  Relative Name and Phone Number:       Current Level of Care: Hospital Recommended Level of Care: Skilled Nursing Facility Prior Approval Number:    Date Approved/Denied:   PASRR Number:   [id number]   Discharge Plan: SNF    Current Diagnoses: Patient Active Problem List   Diagnosis Date Noted  . Closed fracture of left proximal tibia 01/30/2017  . AKI (acute kidney injury) (HCC) 01/30/2017  . Normal anion gap metabolic acidosis 01/30/2017  . Tibial fracture 01/30/2017    Orientation RESPIRATION BLADDER Height & Weight     Self, Time, Situation, Place  Normal Continent Weight: 127 lb 13.9 oz (58 kg) Height:  5\' 3"  (160 cm)  BEHAVIORAL SYMPTOMS/MOOD NEUROLOGICAL BOWEL NUTRITION STATUS      Continent Diet (See DC summary)  AMBULATORY STATUS COMMUNICATION OF NEEDS Skin   Extensive Assist Verbally Surgical wounds, Skin abrasions, Bruising                       Personal Care Assistance Level of Assistance  Bathing, Feeding, Dressing Bathing Assistance: Limited assistance Feeding assistance: Independent Dressing Assistance: Limited assistance     Functional Limitations Info  Sight, Hearing, Speech Sight Info: Adequate Hearing Info: Adequate Speech Info: Adequate    SPECIAL CARE FACTORS FREQUENCY  PT (By licensed PT), OT (By licensed OT)     PT Frequency: 5x OT Frequency: 5x            Contractures Contractures Info: Not present    Additional Factors Info  Code Status, Allergies Code Status Info: DNR Allergies Info: NKA            Current Medications (02/01/2017):  This is the current hospital active medication list Current Facility-Administered Medications  Medication Dose Route Frequency Provider Last Rate Last Dose  . acetaminophen (TYLENOL) tablet 650 mg  650 mg Oral Q6H PRN Marinda Elk, MD   650 mg at 01/31/17 2155   Or  . acetaminophen (TYLENOL) suppository 650 mg  650 mg Rectal Q6H PRN Marinda Elk, MD      . amLODipine (NORVASC) tablet 5 mg  5 mg Oral Daily Marinda Elk, MD   5 mg at 02/01/17 0956  . heparin injection 5,000 Units  5,000 Units Subcutaneous Q8H Marinda Elk, MD   5,000 Units at 02/01/17 0527  . ondansetron (ZOFRAN) tablet 4 mg  4 mg Oral Q6H PRN Marinda Elk, MD       Or  . ondansetron [facility]) injection 4 mg  4 mg Intravenous Q6H PRN Marinda Elk, MD      . oxyCODONE (Oxy IR/ROXICODONE) immediate release tablet 5 mg  5 mg Oral Q4H PRN Marinda Elk, MD   5 mg at 02/01/17 0956  . polyethylene glycol (MIRALAX / GLYCOLAX) packet 17 g  17 g Oral Daily Marinda Elk, MD         Discharge Medications: Please see discharge summary for a list of discharge medications.  Relevant Imaging Results:  Relevant Lab Results:   Additional Information  SSN:  [id number]  Non displaced right proximal tibia fracture: NWB RLE Knee Immobilizer at all times Ankle Pumps   Altan Kraai, Evlyn CourierHannah N, LCSW

## 2017-02-01 NOTE — Evaluation (Signed)
Physical Therapy Evaluation Patient Details Name: Sharon Ryan MRN: [id number] DOB: [birth year] Today's Date: 02/01/2017   History of Present Illness  83 yo female admitted with closed fx of proximal R tibia after falling at home. Ortho consulted: non-operative management.     Clinical Impression  On eval, pt required Max-Total assist +2 for mobility. She sat EOB for ~5 minutes with Min guard assist. Attempted sit to stand x 2 but pt was unable. Pt presents with general weakness, decreased activity tolerance, and impaired gait and balance. Mobility is currently limited by pain and weakness. Reviewed precautions/restrictions with pt. Recommend SNF for continued rehab. Will follow and progress activity as tolerated.     Follow Up Recommendations SNF    Equipment Recommendations   (TBD at next venue)    Recommendations for Other Services       Precautions / Restrictions Precautions Precautions: Fall Required Braces or Orthoses: Knee Immobilizer - Right Knee Immobilizer - Right: On at all times Restrictions Weight Bearing Restrictions: Yes RLE Weight Bearing: Non weight bearing      Mobility  Bed Mobility Overal bed mobility: Needs Assistance Bed Mobility: Supine to Sit;Sit to Supine     Supine to sit: Max assist;+2 for physical assistance;+2 for safety/equipment;HOB elevated Sit to supine: Total assist;+2 for physical assistance;+2 for safety/equipment;HOB elevated   General bed mobility comments: Assist for trunk and bil LEs. Utilized bedpad for scooting, positioning. Increased time. Multimodal cueing for technique.   Transfers Overall transfer level: Needs assistance Equipment used: Rolling walker (2 wheeled) Transfers: Sit to/from Stand Sit to Stand: Total assist;+2 physical assistance;+2 safety/equipment;From elevated surface         General transfer comment: Attempted sit to stand x 2. Limited by pain, weakness. Pt was able to clear bottom from surface but  unable to get to full standing.   Ambulation/Gait                Stairs            Wheelchair Mobility    Modified Rankin (Stroke Patients Only)       Balance Overall balance assessment: Needs assistance Sitting-balance support: Feet supported;Bilateral upper extremity supported Sitting balance-Leahy Scale: Fair Sitting balance - Comments: Sat EOB ~ 5 minutes with Min guard assist.    Standing balance support: Bilateral upper extremity supported Standing balance-Leahy Scale: Zero                               Pertinent Vitals/Pain Pain Assessment: Faces Faces Pain Scale: Hurts even more Pain Location: R LE with activity Pain Descriptors / Indicators: Grimacing;Guarding;Sharp Pain Intervention(s): Limited activity within patient's tolerance;Repositioned    Home Living Family/patient expects to be discharged to:: Skilled nursing facility Living Arrangements: Alone                    Prior Function                 Hand Dominance        Extremity/Trunk Assessment   Upper Extremity Assessment Upper Extremity Assessment: Generalized weakness    Lower Extremity Assessment Lower Extremity Assessment: Generalized weakness;RLE deficits/detail RLE Deficits / Details: NT. KI in place.    Cervical / Trunk Assessment Cervical / Trunk Assessment: Kyphotic  Communication   Communication: No difficulties  Cognition Arousal/Alertness: Awake/alert Behavior During Therapy: WFL for tasks assessed/performed Overall Cognitive Status: Within Functional Limits for tasks assessed  General Comments      Exercises     Assessment/Plan    PT Assessment Patient needs continued PT services  PT Problem List Decreased strength;Decreased mobility;Decreased activity tolerance;Decreased balance;Decreased knowledge of use of DME;Pain       PT Treatment Interventions DME instruction;Gait  training;Therapeutic activities;Therapeutic exercise;Patient/family education;Balance training;Functional mobility training    PT Goals (Current goals can be found in the Care Plan section)  Acute Rehab PT Goals Patient Stated Goal: less pain.  PT Goal Formulation: With patient Time For Goal Achievement: 02/15/17 Potential to Achieve Goals: Good    Frequency Min 3X/week   Barriers to discharge        Co-evaluation               AM-PAC PT "6 Clicks" Daily Activity  Outcome Measure Difficulty turning over in bed (including adjusting bedclothes, sheets and blankets)?: Unable Difficulty moving from lying on back to sitting on the side of the bed? : Unable Difficulty sitting down on and standing up from a chair with arms (e.g., wheelchair, bedside commode, etc,.)?: Unable Help needed moving to and from a bed to chair (including a wheelchair)?: Total Help needed walking in hospital room?: Total Help needed climbing 3-5 steps with a railing? : Total 6 Click Score: 6    End of Session Equipment Utilized During Treatment: Gait belt;Right knee immobilizer Activity Tolerance: Patient limited by pain;Patient limited by fatigue Patient left: in bed;with call bell/phone within reach;with bed alarm set   PT Visit Diagnosis: Muscle weakness (generalized) (M62.81);Difficulty in walking, not elsewhere classified (R26.2)    Time: 1034-1050 PT Time Calculation (min) (ACUTE ONLY): 16 min   Charges:   PT Evaluation $PT Eval Moderate Complexity: 1 Mod     PT G Codes:        Sharon Ryan, MPT Pager: [phone]

## 2017-02-01 NOTE — Progress Notes (Signed)
OT Cancellation Note  Patient Details Name: Valeta HarmsCharlotte E Zenk MRN: [id number] DOB: 09/25/1918   Cancelled Treatment:    Reason Eval/Treat Not Completed: Other (comment)  Noted plans for SNF- [facility] Kung[facility] D 02/01/2017, 10:53 AM

## 2017-02-01 NOTE — Discharge Summary (Addendum)
Physician Discharge Summary  Sharon Ryan ZOX:[id number] DOB: 1918/12/16 DOA: 01/30/2017  PCP: Ralene Ok, MD  Admit date: 01/30/2017 Discharge date: 02/01/2017  Admitted From: Home Disposition:  SNF  Recommendations for Outpatient Follow-up:  1. Follow up with orthopedic surgery in 1 week.  Home Health:No Equipment/Devices:none  Discharge Condition:stable CODE STATUS:DNR Diet recommendation: Heart Healthy  Brief/Interim Summary: 83 y.o. female with medical history significant of no significant past medical history lives alone for the last several years that comes in after mechanical fall that happened when she was getting into the bathtub. She relates she did not lose consciousness, no blurry vision no dizziness, no chest pain, no shortness of breath, no diarrhea, no fever.  Discharge Diagnoses:  Active Problems:   Closed fracture of Right proximal tibia   Normal anion gap metabolic acidosis   Tibial fracture  Spoke to Dr. Joanna Puff this morning he relates she's not to follow-up at his office in one week for repeated x-ray, he recommended a brace at all times. In nonweightbearing. Physical therapy was consulted who recommended skilled nursing facility. Continue narcotics for pain control we have added MiraLAX to prevent constipation.    Discharge Instructions  Discharge Instructions    Diet - low sodium heart healthy    Complete by:  As directed    Increase activity slowly    Complete by:  As directed      Allergies as of 02/01/2017   No Known Allergies     Medication List    TAKE these medications   amLODipine 5 MG tablet Commonly known as:  NORVASC Take 5 mg by mouth daily.   chlorthalidone 25 MG tablet Commonly known as:  HYGROTON Take 25 mg by mouth daily.   cloNIDine 0.2 MG tablet Commonly known as:  CATAPRES Take 0.2 mg by mouth at bedtime.   metoprolol succinate 50 MG 24 hr tablet Commonly known as:  TOPROL-XL Take 50 mg by mouth daily.  Take with or immediately following a meal.   oxyCODONE 5 MG immediate release tablet Commonly known as:  Oxy IR/ROXICODONE Take 1 tablet (5 mg total) by mouth every 4 (four) hours as needed for moderate pain.   polyethylene glycol packet Commonly known as:  MIRALAX / GLYCOLAX Take 17 g by mouth daily.   ramipril 5 MG capsule Commonly known as:  ALTACE Take 5 mg by mouth daily.      Follow-up Information    Swinteck, Arlys John, MD. Schedule an appointment as soon as possible for a visit in 1 week(s).   Specialty:  Orthopedic Surgery Contact information: [address] [place] [address] [phone]          No Known Allergies  Consultations:  Orthopedics   Procedures/Studies: Dg Tibia/fibula Right  Result Date: 01/30/2017 CLINICAL DATA:  Fall in shower today with right lower leg pain. EXAM: RIGHT TIBIA AND FIBULA - 2 VIEW COMPARISON:  None. FINDINGS: Diffuse osteopenia. Evidence of patient's minimally displaced transverse fracture of the proximal tibial metaphysis. Degenerative changes over the knee. Knee joint effusion. Mild degenerative change over the ankle. IMPRESSION: Minimally displaced transverse fracture of the proximal tibial metaphysis. Degenerative changes of the knee and ankle.  Knee joint effusion. Electronically Signed   By: Elberta Fortis M.D.   On: 01/30/2017 15:34   Dg Knee Complete 4 Views Right  Result Date: 01/30/2017 CLINICAL DATA:  Fall in shower today with right knee pain as well as lower leg pain. EXAM: RIGHT KNEE - COMPLETE 4+ VIEW  COMPARISON:  None. FINDINGS: Exam demonstrates mild tricompartmental osteoarthritic change. There is diffuse osteopenia. There is a minimally displaced transverse fracture of the proximal tibial metaphysis. Moderate-sized joint effusion. IMPRESSION: Minimally displaced transverse fracture of the proximal tibial metaphysis. Moderate joint effusion. Mild tricompartmental osteoarthritis. Electronically Signed    By: Elberta Fortisaniel  Boyle M.D.   On: 01/30/2017 15:32      Subjective: No new complains  Discharge Exam: Vitals:   01/31/17 2147 02/01/17 0528  BP: 140/79 (!) 156/92  Pulse: 79 72  Resp: 16 16  Temp: 98 F (36.7 C) 98.4 F (36.9 C)  SpO2: 93% 90%   Vitals:   01/31/17 0637 01/31/17 1425 01/31/17 2147 02/01/17 0528  BP: 130/78 129/73 140/79 (!) 156/92  Pulse: 79 80 79 72  Resp: 15 16 16 16   Temp: 98.2 F (36.8 C) 98.3 F (36.8 C) 98 F (36.7 C) 98.4 F (36.9 C)  TempSrc: Oral Oral Oral Oral  SpO2: 94% 95% 93% 90%  Weight:      Height:        General: Pt is alert, awake, not in acute distress Cardiovascular: RRR, S1/S2 +, no rubs, no gallops Respiratory: CTA bilaterally, no wheezing, no rhonchi Abdominal: Soft, NT, ND, bowel sounds + Extremities: no edema, no cyanosis    The results of significant diagnostics from this hospitalization (including imaging, microbiology, ancillary and laboratory) are listed below for reference.     Microbiology: No results found for this or any previous visit (from the past 240 hour(s)).   Labs: BNP (last 3 results) No results for input(s): BNP in the last 8760 hours. Basic Metabolic Panel:  Recent Labs Lab 01/30/17 1540 01/30/17 2125 02/01/17 0511  NA 136  --  137  K 3.8  --  3.8  CL 108  --  107  CO2 17*  --  21*  GLUCOSE 138*  --  105*  BUN 39*  --  32*  CREATININE 1.42* 1.50* 1.48*  CALCIUM 8.7*  --  8.7*   Liver Function Tests: No results for input(s): AST, ALT, ALKPHOS, BILITOT, PROT, ALBUMIN in the last 168 hours. No results for input(s): LIPASE, AMYLASE in the last 168 hours. No results for input(s): AMMONIA in the last 168 hours. CBC:  Recent Labs Lab 01/30/17 1540 01/30/17 2125  WBC 5.5 4.5  NEUTROABS 4.6  --   HGB 8.8* 9.4*  HCT 26.3* 27.4*  MCV 85.4 84.8  PLT 146* 143*   Cardiac Enzymes: No results for input(s): CKTOTAL, CKMB, CKMBINDEX, TROPONINI in the last 168 hours. BNP: Invalid input(s):  POCBNP CBG: No results for input(s): GLUCAP in the last 168 hours. D-Dimer No results for input(s): DDIMER in the last 72 hours. Hgb A1c No results for input(s): HGBA1C in the last 72 hours. Lipid Profile No results for input(s): CHOL, HDL, LDLCALC, TRIG, CHOLHDL, LDLDIRECT in the last 72 hours. Thyroid function studies No results for input(s): TSH, T4TOTAL, T3FREE, THYROIDAB in the last 72 hours.  Invalid input(s): FREET3 Anemia work up No results for input(s): VITAMINB12, FOLATE, FERRITIN, TIBC, IRON, RETICCTPCT in the last 72 hours. Urinalysis No results found for: COLORURINE, APPEARANCEUR, LABSPEC, PHURINE, GLUCOSEU, HGBUR, BILIRUBINUR, KETONESUR, PROTEINUR, UROBILINOGEN, NITRITE, LEUKOCYTESUR Sepsis Labs Invalid input(s): PROCALCITONIN,  WBC,  LACTICIDVEN Microbiology No results found for this or any previous visit (from the past 240 hour(s)).   Time coordinating discharge: Over 30 minutes  SIGNED:   Marinda ElkFELIZ ORTIZ, Silvester Reierson, MD  [facility] 02/01/2017, 8:45 AM Pager   If 7PM-7AM, please  contact night-coverage [email or web address] Password [email or web address]

## 2017-02-01 NOTE — Clinical Social Work Note (Signed)
Clinical Social Work Assessment  Patient Details  Name: Sharon Ryan MRN: [id number] Date of Birth: 09/25/1918  Date of referral:  02/01/17               Reason for consult:  Discharge Planning, Facility Placement                Permission sought to share information with:  Case Manager, Facility Medical sales representativeContact Representative, Family Supports Permission granted to share information::  Yes, Verbal Permission Granted  Name::        Agency::     Relationship::  Pecolia AdesSon Bodo:  [phone],  Fred/ caregiver friend:  [phone]  or her cleaning lady: Marchelle GearingSherri Evans:  [phone]   Contact Information:     Housing/Transportation Living arrangements for the past 2 months:  Apartment Source of Information:  Patient, Medical Team, Case Manager, Friend/Neighbor Patient Interpreter Needed:  None Criminal Activity/Legal Involvement Pertinent to Current Situation/Hospitalization:  No - Comment as needed Significant Relationships:  Adult Children, Friend, [employer] Support Lives with:  Self Do you feel safe going back to the place where you live?  No Need for family participation in patient care:  No (Coment)  Care giving concerns:  Patient has [place] [employer]  Patient reports she was taking a bath and slipped and hit her knee causing her to fall and not have the ability to get up.  Patient reports she hit her life alert necklace, but nothing worked. She waited for about 3 hours on the bathroom floor and was wet, naked, and finally scooted her way through the house to get to her home phone. She threw multiple objects at the phone until she finally was able to knock it down and call EMS.  Patient reports she lives alone, has a house cleaner and a friend named Merlyn AlbertFred who is also a caregiver and takes her to the grocery store. Cleaning lady has come by the hospital and brought her some magazines and her address book with all information.  Due to patient  living alone and immobile, patient is being recommended for SNF. She is in agreement but has no idea where she would go.  While LCSW was in room, friend Merlyn AlbertFred called and speaks [place] with patient. He was able to help with the disposition and continued follow up with patient.  Patient gives permission to speak with the cleaning lady as well as Merlyn AlbertFred.   Social Worker assessment / plan:  Consult for SNF placement  Patient is alert and oriented x4. She is agreeable to plan and able to give recent events of admission. She does not have a preference for bed availability and Merlyn AlbertFred wants her in the best place possible.  LCSW will follow up with Merlyn AlbertFred and Patient regarding bed status and offers. Merlyn AlbertFred confirms he will follow up with patient after hospital and continue to assist with her needs.  Bed offers pending. PT just completed consult. Aware of discharge to SNF today, working with facilities on bed placement.   Employment status:  Retired Health and safety inspectornsurance information:  [employer] PT Recommendations:  Skilled Nursing Facility Information / Referral to community resources:  Skilled Nursing Facility  Patient/Family's Response to care:  Patient agreeable to plan  Patient/Family's Understanding of and Emotional Response to Diagnosis, Current Treatment, and Prognosis:  Patient voices she is too old and has outlived many of her friends. She reports she keeps in touch with her son and he is aware of her  admission, but he lives in [place].  She voices understanding of need for 24 hour care.  She is anxious about her bed placement as she does not know where to go.  Emotional Assessment Appearance:  Appears stated age Attitude/Demeanor/Rapport:    Affect (typically observed):  Accepting, Adaptable, Pleasant Orientation:  Oriented to Self, Oriented to Place, Oriented to  Time, Oriented to Situation Alcohol / Substance use:  Not Applicable Psych involvement (Current and /or in the community):  No  (Comment)  Discharge Needs  Concerns to be addressed:  Denies Needs/Concerns at this time Readmission within the last 30 days:  No Current discharge risk:  None Barriers to Discharge:  No Barriers Identified   Raye Sorrow, LCSW 02/01/2017, 11:21 AM

## 2017-02-01 NOTE — Clinical Social Work Placement (Addendum)
12:59 PM [facility] Patient can transfer today. FriendWilleen Niece: [name] contacted per request of patient and notified of bed offers and choice. Fred assisted patient with choice.  [phone]  Or home:  [phone] Son contacted and updated regarding plan.  [facility] has accepted: Rm:  101P  Report:  [phone]  Patient will transfer by EMS. No other needs DC today  CLINICAL SOCIAL WORK PLACEMENT  NOTE  Date:  02/01/2017  Patient Details  Name: Sharon Ryan MRN: [id number] Date of Birth: 09/25/1918  Clinical Social Work is seeking post-discharge placement for this patient at the Skilled  Nursing Facility level of care (*CSW will initial, date and re-position this form in  chart as items are completed):  Yes   Patient/family provided with [facility] Clinical Social Work Department's list of facilities offering this level of care within the geographic area requested by the patient (or if unable, by the patient's family).  Yes   Patient/family informed of their freedom to choose among providers that offer the needed level of care, that participate in Medicare, Medicaid or managed care program needed by the patient, have an available bed and are willing to accept the patient.  Yes   Patient/family informed of [facility]'s ownership interest in [facility] and [facility], as well as of the fact that they are under no obligation to receive care at these facilities.  PASRR submitted to EDS on 02/01/17     PASRR number received on 02/01/17     Existing PASRR number confirmed on       FL2 transmitted to all facilities in geographic area requested by pt/family on 02/01/17     FL2 transmitted to all facilities within larger geographic area on       Patient informed that his/her managed care company has contracts with or will negotiate with certain facilities, including the following:            Patient/family informed of bed offers  received. 02/01/2017   [facility]  Physician recommends and patient chooses bed at      Patient to be transferred to   on  .  02/01/2017   Patient to be transferred to facility by      EMS  Patient family notified on   of transfer.  Sharon Ryan Friend  Name of family member notified:        PHYSICIAN Please sign FL2, Please sign DNR     Additional Comment:    _______________________________________________ Raye Sorrowoble, Sharon Mesch N, LCSW 02/01/2017, 10:32 AM

## 2017-02-02 ENCOUNTER — Other Ambulatory Visit: Payer: Self-pay | Admitting: Internal Medicine

## 2017-05-14 ENCOUNTER — Emergency Department (HOSPITAL_COMMUNITY): Payer: Medicare Other

## 2017-05-14 ENCOUNTER — Encounter (HOSPITAL_COMMUNITY): Payer: Self-pay | Admitting: Emergency Medicine

## 2017-05-14 ENCOUNTER — Other Ambulatory Visit: Payer: Self-pay

## 2017-05-14 ENCOUNTER — Inpatient Hospital Stay (HOSPITAL_COMMUNITY)
Admission: EM | Admit: 2017-05-14 | Discharge: 2017-05-17 | DRG: 065 | Disposition: A | Discharge status: Skilled Nursing Facility | Payer: Medicare Other | Attending: Internal Medicine | Admitting: Internal Medicine

## 2017-05-14 DIAGNOSIS — I63432 Cerebral infarction due to embolism of left posterior cerebral artery: Secondary | ICD-10-CM | POA: Diagnosis present

## 2017-05-14 DIAGNOSIS — Z515 Encounter for palliative care: Secondary | ICD-10-CM | POA: Diagnosis present

## 2017-05-14 DIAGNOSIS — R29723 NIHSS score 23: Secondary | ICD-10-CM | POA: Diagnosis present

## 2017-05-14 DIAGNOSIS — Z66 Do not resuscitate: Secondary | ICD-10-CM | POA: Diagnosis present

## 2017-05-14 DIAGNOSIS — R4701 Aphasia: Secondary | ICD-10-CM | POA: Diagnosis present

## 2017-05-14 DIAGNOSIS — N289 Disorder of kidney and ureter, unspecified: Secondary | ICD-10-CM | POA: Diagnosis not present

## 2017-05-14 DIAGNOSIS — I4892 Unspecified atrial flutter: Secondary | ICD-10-CM | POA: Diagnosis present

## 2017-05-14 DIAGNOSIS — I4581 Long QT syndrome: Secondary | ICD-10-CM | POA: Diagnosis present

## 2017-05-14 DIAGNOSIS — I483 Typical atrial flutter: Secondary | ICD-10-CM

## 2017-05-14 DIAGNOSIS — N179 Acute kidney failure, unspecified: Secondary | ICD-10-CM | POA: Diagnosis present

## 2017-05-14 DIAGNOSIS — I445 Left posterior fascicular block: Secondary | ICD-10-CM | POA: Diagnosis present

## 2017-05-14 DIAGNOSIS — I10 Essential (primary) hypertension: Secondary | ICD-10-CM | POA: Diagnosis not present

## 2017-05-14 DIAGNOSIS — I639 Cerebral infarction, unspecified: Secondary | ICD-10-CM | POA: Diagnosis present

## 2017-05-14 DIAGNOSIS — I361 Nonrheumatic tricuspid (valve) insufficiency: Secondary | ICD-10-CM | POA: Diagnosis not present

## 2017-05-14 DIAGNOSIS — R40242 Glasgow coma scale score 9-12, unspecified time: Secondary | ICD-10-CM | POA: Diagnosis present

## 2017-05-14 DIAGNOSIS — Z8781 Personal history of (healed) traumatic fracture: Secondary | ICD-10-CM | POA: Diagnosis not present

## 2017-05-14 DIAGNOSIS — R29810 Facial weakness: Secondary | ICD-10-CM | POA: Diagnosis present

## 2017-05-14 DIAGNOSIS — Z79899 Other long term (current) drug therapy: Secondary | ICD-10-CM | POA: Diagnosis not present

## 2017-05-14 DIAGNOSIS — I1310 Hypertensive heart and chronic kidney disease without heart failure, with stage 1 through stage 4 chronic kidney disease, or unspecified chronic kidney disease: Secondary | ICD-10-CM | POA: Diagnosis present

## 2017-05-14 DIAGNOSIS — I6523 Occlusion and stenosis of bilateral carotid arteries: Secondary | ICD-10-CM | POA: Diagnosis present

## 2017-05-14 DIAGNOSIS — G8191 Hemiplegia, unspecified affecting right dominant side: Secondary | ICD-10-CM | POA: Diagnosis present

## 2017-05-14 DIAGNOSIS — I4891 Unspecified atrial fibrillation: Secondary | ICD-10-CM | POA: Diagnosis present

## 2017-05-14 DIAGNOSIS — D631 Anemia in chronic kidney disease: Secondary | ICD-10-CM | POA: Diagnosis present

## 2017-05-14 DIAGNOSIS — R627 Adult failure to thrive: Secondary | ICD-10-CM | POA: Diagnosis present

## 2017-05-14 DIAGNOSIS — N183 Chronic kidney disease, stage 3 (moderate): Secondary | ICD-10-CM | POA: Diagnosis present

## 2017-05-14 HISTORY — DX: Unspecified osteoarthritis, unspecified site: M19.90

## 2017-05-14 LAB — COMPREHENSIVE METABOLIC PANEL
ALT: 14 U/L (ref 14–54)
ANION GAP: 12 (ref 5–15)
AST: 24 U/L (ref 15–41)
Albumin: 3.4 g/dL — ABNORMAL LOW (ref 3.5–5.0)
Alkaline Phosphatase: 85 U/L (ref 38–126)
BILIRUBIN TOTAL: 0.8 mg/dL (ref 0.3–1.2)
BUN: 38 mg/dL — AB (ref 6–20)
CHLORIDE: 105 mmol/L (ref 101–111)
CO2: 19 mmol/L — ABNORMAL LOW (ref 22–32)
Calcium: 9.2 mg/dL (ref 8.9–10.3)
Creatinine, Ser: 1.8 mg/dL — ABNORMAL HIGH (ref 0.44–1.00)
GFR calc non Af Amer: 23 mL/min — ABNORMAL LOW (ref >60)
GFR, EST AFRICAN AMERICAN: 26 mL/min — AB (ref >60)
Glucose, Bld: 128 mg/dL — ABNORMAL HIGH (ref 65–99)
POTASSIUM: 4.3 mmol/L (ref 3.5–5.1)
Sodium: 136 mmol/L (ref 135–145)
TOTAL PROTEIN: 6.6 g/dL (ref 6.5–8.1)

## 2017-05-14 LAB — I-STAT TROPONIN, ED: TROPONIN I, POC: 0.07 ng/mL (ref 0.00–0.08)

## 2017-05-14 LAB — I-STAT CHEM 8, ED
BUN: 33 mg/dL — ABNORMAL HIGH (ref 6–20)
CALCIUM ION: 1.17 mmol/L (ref 1.15–1.40)
CREATININE: 1.8 mg/dL — AB (ref 0.44–1.00)
Chloride: 103 mmol/L (ref 101–111)
GLUCOSE: 125 mg/dL — AB (ref 65–99)
HCT: 30 % — ABNORMAL LOW (ref 36.0–46.0)
HEMOGLOBIN: 10.2 g/dL — AB (ref 12.0–15.0)
Potassium: 4.3 mmol/L (ref 3.5–5.1)
Sodium: 136 mmol/L (ref 135–145)
TCO2: 20 mmol/L — ABNORMAL LOW (ref 22–32)

## 2017-05-14 LAB — CBC
HEMATOCRIT: 30.4 % — AB (ref 36.0–46.0)
HEMOGLOBIN: 9.5 g/dL — AB (ref 12.0–15.0)
MCH: 27 pg (ref 26.0–34.0)
MCHC: 31.3 g/dL (ref 30.0–36.0)
MCV: 86.4 fL (ref 78.0–100.0)
Platelets: 180 10*3/uL (ref 150–400)
RBC: 3.52 MIL/uL — ABNORMAL LOW (ref 3.87–5.11)
RDW: 16.1 % — AB (ref 11.5–15.5)
WBC: 5.6 10*3/uL (ref 4.0–10.5)

## 2017-05-14 LAB — DIFFERENTIAL
BASOS ABS: 0 10*3/uL (ref 0.0–0.1)
BASOS PCT: 0 %
EOS PCT: 1 %
Eosinophils Absolute: 0 10*3/uL (ref 0.0–0.7)
LYMPHS ABS: 1.6 10*3/uL (ref 0.7–4.0)
Lymphocytes Relative: 29 %
MONO ABS: 0.6 10*3/uL (ref 0.1–1.0)
Monocytes Relative: 10 %
Neutro Abs: 3.4 10*3/uL (ref 1.7–7.7)
Neutrophils Relative %: 60 %

## 2017-05-14 LAB — CBG MONITORING, ED: Glucose-Capillary: 116 mg/dL — ABNORMAL HIGH (ref 65–99)

## 2017-05-14 LAB — APTT: APTT: 35 s (ref 24–36)

## 2017-05-14 LAB — PROTIME-INR
INR: 1.14
Prothrombin Time: 14.5 seconds (ref 11.4–15.2)

## 2017-05-14 MED ORDER — STROKE: EARLY STAGES OF RECOVERY BOOK
Freq: Once | Status: DC
  Filled 2017-05-14: qty 1

## 2017-05-14 MED ORDER — SODIUM CHLORIDE 0.9 % IV SOLN
INTRAVENOUS | Status: DC
  Administered 2017-05-14 – 2017-05-15 (×2): via INTRAVENOUS

## 2017-05-14 MED ORDER — ACETAMINOPHEN 325 MG PO TABS: 650.0000 mg | ORAL_TABLET | ORAL | Status: DC | PRN

## 2017-05-14 MED ORDER — ACETAMINOPHEN 650 MG RE SUPP: 650.0000 mg | RECTAL | Status: DC | PRN

## 2017-05-14 MED ORDER — ASPIRIN 300 MG RE SUPP
300.0000 mg | Freq: Every day | RECTAL | Status: DC
Start: 2017-05-14 — End: 2017-05-17
  Administered 2017-05-16 – 2017-05-17 (×2): 300 mg via RECTAL
  Filled 2017-05-14 (×2): qty 1

## 2017-05-14 MED ORDER — IOPAMIDOL (ISOVUE-370) INJECTION 76%
INTRAVENOUS | Status: AC
  Filled 2017-05-14: qty 100

## 2017-05-14 MED ORDER — ACETAMINOPHEN 160 MG/5ML PO SOLN: 650.0000 mg | ORAL | Status: DC | PRN

## 2017-05-14 MED ORDER — HYDRALAZINE HCL 20 MG/ML IJ SOLN: 10.0000 mg | Freq: Four times a day (QID) | INTRAMUSCULAR | Status: DC | PRN

## 2017-05-14 NOTE — Consult Note (Addendum)
Requesting Physician: Dr. ED    Chief Complaint: Code Stroke  History obtained from: EMS  HPI:                                                                                                                                         Sharon Ryan is an 84 y.o. female brought by EMS as code stroke from a nursing home.  She was last seen normal at 8:00 last night.  Patient was not seen until 930 this morning.  When seen she was noted to be a phasic, moaning, right facial droop, right sided weakness.  [facility].  In route she is noted to be in new atrial flutter.  Initial CT scan did not show any blood.  CT was followed by CTA of head and neck.  No large vessel occlusion in the M1 or M2 was noted.  Unfortunately patient was not a TPA candidate as she was out of the window.  And she was not an IR candidate due to only large vessel occluded being in the territory of the already visible stroke.  Date last known well: Date: 05/13/2017 Time last known well: Time: 20:00 tPA Given: No: out of window NIH stroke scale 23  Modified Rankin: Rankin Score=3   Past Medical History:  Diagnosis Date  . Hypertension     No past surgical history on file.  Family History  Problem Relation Age of Onset  . Heart attack Father    Social History:  reports that  has never smoked. she has never used smokeless tobacco. She reports that she does not drink alcohol or use drugs.  Allergies: No Known Allergies  Medications:                                                                                                                           No current facility-administered medications for this encounter.    Current Outpatient Medications  Medication Sig Dispense Refill  . amLODipine (NORVASC) 5 MG tablet Take 5 mg by mouth daily.    . chlorthalidone (HYGROTON) 25 MG tablet Take 25 mg by mouth daily.    . cloNIDine (CATAPRES) 0.2 MG tablet Take 0.2 mg  by mouth at bedtime.    . metoprolol succinate (TOPROL-XL) 50 MG 24 hr tablet Take 50 mg by  mouth daily. Take with or immediately following a meal.    . oxyCODONE (OXY IR/ROXICODONE) 5 MG immediate release tablet Take 1 tablet (5 mg total) by mouth every 4 (four) hours as needed for moderate pain. 5 tablet 0  . polyethylene glycol (MIRALAX / GLYCOLAX) packet Take 17 g by mouth daily. 14 each 0  . ramipril (ALTACE) 5 MG capsule Take 5 mg by mouth daily.       ROS:                                                                                                                                       History obtained from unobtainable from patient due to mental status    General Examination:                                                                                                      There were no vitals taken for this visit.  HEENT-  Normocephalic, no lesions, without obvious abnormality.  Normal external eye and conjunctiva.   Cardiovascular- S1-S2 audible, pulses palpable throughout   Lungs-no rhonchi or wheezing noted, no excessive working breathing.  Saturations within normal limits Abdomen- All 4 quadrants palpated and nontender Extremities- Warm, dry and intact Musculoskeletal-no joint tenderness, deformity or swelling Skin-warm and dry, no hyperpigmentation, vitiligo, or suspicious lesions  Neurological Examination Mental Status: Patient is awake, moaning, moving her left arm purposefully and bilateral legs spontaneously.. Cranial Nerves: II: Right hemianopsia III,IV, VI: ptosis not present, extra-ocular motions intact bilaterally spontaneously, pupils equal, round, reactive to light and accommodation V,VII: smile asymmetric on the right, decreased to noxious stimuli on the right VIII: Does not follow verbal commands IX,X: Able to assess XI: Moving left shoulder greater than right XII: Unable to assess Motor: Right : Upper extremity   0/5    Left:     Upper extremity    5/5  Lower extremity   5/5     Lower extremity   5/5 Tone and bulk:normal tone throughout; no atrophy noted Sensory: Withdraws to noxious stimuli bilateral legs and left arm  Deep Tendon Reflexes: No ankle reflex no knee reflex with 1+ brachioradialis and bicep Plantars: Mute bilaterally Cerebellar: Unable to assess  gait: Not tested   Lab Results: Basic Metabolic Panel: No results for input(s): NA, K, CL, CO2, GLUCOSE, BUN, CREATININE, CALCIUM, MG, PHOS in the last 168 hours.  CBC: No results for input(s): WBC, NEUTROABS, HGB, HCT, MCV, PLT in the last 168  hours.  Lipid Panel: No results for input(s): CHOL, TRIG, HDL, CHOLHDL, VLDL, LDLCALC in the last 168 hours.  CBG: No results for input(s): GLUCAP in the last 168 hours.  Imaging: No results found.  Assessment and plan discussed with with attending physician and they are in agreement.    Felicie Morn PA-C [facility] [phone]  05/14/2017, 11:24 AM   Assessment: 84 y.o. female presenting to the hospital with new onset of right facial droop, right hemianopsia, right upper extremity flaccidity.  CTA of head and neck did not show any large vessel occlusion.  I think that the subacute on chronic appearing left PCA infarct could be responsible for her current presentation and the thalamic involvement.  Certainly ischemic stroke I think is most likely.  Given the degree of her impairment, and her age, I think that discussions regarding end-of-life care would be appropriate at this time.  If aggressive care is desired, secondary prevention would include anticoagulation at some point down the line but would hold off for now.  If aggressive care is desired, echo, A1c, lipid panel, PT, ST, OT would be appropriate.  If 7pm- 7am, please page neurology on call as listed in AMION.

## 2017-05-14 NOTE — ED Notes (Signed)
Pt's caregiver Willeen NieceFred Cotton cell [phone] updated on plan of care. Also provided pt's son's contact info: Ginette OttoBodo Vanstone [phone] please call with any questions or concerns

## 2017-05-14 NOTE — ED Triage Notes (Addendum)
Arrived via EMS from [facility] nursing [facility]. Code Stroke called en route. LKW 2000 last night when patient went to sleep.  Staff checked on patient at 0945 patient right side flaccid and right side facial droop. EMS reported patient intermittently following commands weak left side hand grasp. Placed on NRB mask pulse oximetry 88% room air increased to 100%. EMS reported patient in atrial flutter new for patient.

## 2017-05-14 NOTE — H&P (Signed)
History and Physical:    Sharon Ryan   RUE:[id number] DOB: Oct 30, 1918 DOA: 05/14/2017  Referring MD/provider: Dr Patria Mane  PCP: Ralene Ok, MD   Patient coming from: Nursing Home  Chief Complaint: Stroke  History of Present Illness:   Sharon Ryan is an 84 y.o. female who is admitted for new onset right-sided weakness and new onset A. fib aphasia with altered mental status.  Patient is unable to provide any history and unfortunately her son is not present when I went in to see the patient. History is entirely per ER and neurology note.  As previously noted she is a 84 year old female who was living independently until October 2018 when she had a mechanical fall and sustained a tibial fracture. She was admitted here and was discharged to nursing home/rehabilitation facility. She is presently living in the nursing home and was noted to have some clumsiness of her right hand a week back as told to Dr. Patria Mane by her son. This morning however she was noted to have altered mental status, was unable to speak and had right-sided weakness. On route to the ED, patient was noted to be in atrial flutter with a normal rate. This apparently is a new diagnosis for her.  Patient is unable to provide any history or answer any questions to me therefore I'm unable to do review of systems.    ED Course:  The patient underwent a head CT which was negative for bleed. She also underwent a CTA of her head and neck which showed no large vessel occlusion and M1 or M2. She was seen by neurology is not considered a TPA candidate and has no large vessel occlusion. She is now admitted for stroke evaluation and treatment   ROS:   ROS   Unable to do secondary to alteration in consciousness.  Past Medical History:   Past Medical History:  Diagnosis Date  . Hypertension     Past Surgical History:   History reviewed. No pertinent surgical history.  Social History:   Social History    Socioeconomic History  . Marital status: Unknown    Spouse name: Not on file  . Number of children: Not on file  . Years of education: Not on file  . Highest education level: Not on file  Social Needs  . Financial resource strain: Not on file  . Food insecurity - worry: Not on file  . Food insecurity - inability: Not on file  . Transportation needs - medical: Not on file  . Transportation needs - non-medical: Not on file  Occupational History  . Not on file  Tobacco Use  . Smoking status: Never Smoker  . Smokeless tobacco: Never Used  Substance and Sexual Activity  . Alcohol use: No  . Drug use: No  . Sexual activity: No  Other Topics Concern  . Not on file  Social History Narrative  . Not on file    Allergies   Patient has no known allergies.  Family history:   Family History  Problem Relation Age of Onset  . Heart attack Father     Current Medications:   Prior to Admission medications   Medication Sig Start Date End Date Taking? Authorizing Provider  amLODipine (NORVASC) 5 MG tablet Take 5 mg by mouth daily.    [provider]  chlorthalidone (HYGROTON) 25 MG tablet Take 25 mg by mouth daily.    [provider]  cloNIDine (CATAPRES) 0.2 MG tablet Take 0.2 mg  by mouth at bedtime.    [provider]  metoprolol succinate (TOPROL-XL) 50 MG 24 hr tablet Take 50 mg by mouth daily. Take with or immediately following a meal.    [provider]  oxyCODONE (OXY IR/ROXICODONE) 5 MG immediate release tablet Take 1 tablet (5 mg total) by mouth every 4 (four) hours as needed for moderate pain. 02/01/17   Marinda Elk, MD  polyethylene glycol [facility] / Ethelene Hal) packet Take 17 g by mouth daily. 02/01/17   Marinda Elk, MD  ramipril (ALTACE) 5 MG capsule Take 5 mg by mouth daily.    [provider]    Physical Exam:   Vitals:   05/14/17 1214 05/14/17 1215 05/14/17 1220 05/14/17 1230  BP:  (!) 181/91  (!)  175/101  Pulse:  88 67 72  Resp:  (!) 24 (!) 24 18  Temp:      TempSrc:      SpO2:  99% 97% 97%  Weight: 52.7 kg (116 lb 2.9 oz)     Height: 5\' 2"  (1.575 m)        Physical Exam: Blood pressure (!) 175/101, pulse 72, temperature 97.9 F (36.6 C), temperature source Temporal, resp. rate 18, height 5\' 2"  (1.575 m), weight 52.7 kg (116 lb 2.9 oz), SpO2 97 %. Gen: Elderly patient lying in bed moaning gripping onto side rail with left hand right hand flaccidly at her side Eyes: Sclerae anicteric. Conjunctiva mildly injected. Chest: Moderately good air entry bilaterally with no adventitious sounds.  CV: Distant, irregular, no audible murmurs. Abdomen: NABS, soft, nondistended, nontender. No tenderness to light or deep palpation.  Extremities: No edema.  Skin: Warm and dry. No rashes, lesions or wounds. Neuro: Patient with depressed consciousness, she is arousable but remains disoriented to sternal rub, she does not reliably follow orders, Glasgow Coma Scale is 10--opens eyes to speech, makes localize movement to painful stimuli and replies with incomprehensible sounds. More specific neurological exam previously documented in neurology note  Data Review:    Labs: Basic Metabolic Panel: Recent Labs  Lab 05/14/17 1118 05/14/17 1124  NA 136 136  K 4.3 4.3  CL 105 103  CO2 19*  --   GLUCOSE 128* 125*  BUN 38* 33*  CREATININE 1.80* 1.80*  CALCIUM 9.2  --    Liver Function Tests: Recent Labs  Lab 05/14/17 1118  AST 24  ALT 14  ALKPHOS 85  BILITOT 0.8  PROT 6.6  ALBUMIN 3.4*   No results for input(s): LIPASE, AMYLASE in the last 168 hours. No results for input(s): AMMONIA in the last 168 hours. CBC: Recent Labs  Lab 05/14/17 1118 05/14/17 1124  WBC 5.6  --   NEUTROABS 3.4  --   HGB 9.5* 10.2*  HCT 30.4* 30.0*  MCV 86.4  --   PLT 180  --    Cardiac Enzymes: No results for input(s): CKTOTAL, CKMB, CKMBINDEX, TROPONINI in the last 168 hours.  BNP (last 3  results) No results for input(s): PROBNP in the last 8760 hours. CBG: Recent Labs  Lab 05/14/17 1120  GLUCAP 116*    Urinalysis No results found for: COLORURINE, APPEARANCEUR, LABSPEC, PHURINE, GLUCOSEU, HGBUR, BILIRUBINUR, KETONESUR, PROTEINUR, UROBILINOGEN, NITRITE, LEUKOCYTESUR    Radiographic Studies: Ct Angio Head W Or Wo Contrast  Result Date: 05/14/2017 CLINICAL DATA:  84 year old female code stroke. Right facial droop and right side weakness. Last seen normal 2000 hr yesterday. EXAM: CT ANGIOGRAPHY HEAD AND NECK TECHNIQUE: Multidetector CT imaging of  the head and neck was performed using the standard protocol during bolus administration of intravenous contrast. Multiplanar CT image reconstructions and MIPs were obtained to evaluate the vascular anatomy. Carotid stenosis measurements (when applicable) are obtained utilizing NASCET criteria, using the distal internal carotid diameter as the denominator. CONTRAST:  50 milliliters Isovue 370 COMPARISON:  Noncontrast head CT 1126 hr today. FINDINGS: CTA NECK Skeleton: Thoracic kyphoscoliosis. Cervical spine degeneration. Absent dentition. No acute osseous abnormality identified. Upper chest: Cardiomegaly. Calcified coronary artery atherosclerosis. Negative visible lung parenchyma. No superior mediastinal lymphadenopathy. Other neck: Negative. Small thyroid nodules which do not meet consensus criteria for thyroid ultrasound follow-up. No cervical lymphadenopathy. Aortic arch: Moderate soft and calcified plaque throughout the aortic arch and visible descending thoracic aorta. Calcified coronary artery atherosclerosis. 3 vessel arch configuration. Right carotid system: No brachiocephalic or right CCA origin stenosis despite mild plaque. Tortuous proximal right CCA. Soft and calcified plaque at the right carotid bifurcation and right ICA origin with less than 50 % stenosis with respect to the distal vessel of the proximal right ICA. Mildly tortuous  mid cervical right ICA. Left carotid system: No left CCA origin stenosis. Tortuous proximal left CCA. Calcified plaque at the left carotid bifurcation with more bulky lateral left ICA origin and bulb mixed soft and calcified plaque. Still, less than 50 % stenosis with respect to the distal vessel. Tortuous mid cervical left ICA. Vertebral arteries: . Patent no proximal right subclavian right vertebral artery origin artery stenosis without stenosis. Tortuous right V1 segment. Patent right vertebral artery to the skull base without focal stenosis. No proximal left subclavian artery stenosis despite plaque. Normal left vertebral artery origin. Tortuous left V1 segment. Irregularity of the left vertebral artery V2 segment but the vessel remains patent to the skull base without focal stenosis. CTA HEAD Posterior circulation: Patent distal vertebral arteries, the left is diminutive beyond the left PICA origin. The right PICA origin is patent. No distal vertebral artery stenosis. Patent vertebrobasilar junction. Diminutive basilar artery. There is moderate to severe irregularity in the distal 3rd of the basilar with short-segment moderate to severe stenosis of the vessel (series 9, image 106 and series 13, image 24. still, the distal basilar is patent. SCA origins are patent. There are fetal type PCA origins. The left posterior communicating artery is occluded just before the junction with the left P1 segment, which is also occluded. The left P2 and P3 are largely occluded with minimal reconstituted enhancement (series 12, image 20). There is mild to moderate widespread irregularity in the right PCA and its branches which remain patent. Anterior circulation: Both ICA siphons are patent. There is moderate left and severe right ICA siphon calcified plaque. No left siphon stenosis. Left ophthalmic and posterior communicating artery origins are patent. On the right side there is mild to moderate distal cavernous and proximal  supraclinoid ICA stenosis due to calcified plaque. Normal right ophthalmic and posterior communicating artery origins. Patent carotid termini. Patent MCA and ACA origins with only mild irregularity (series 13, image 20). The anterior communicating artery and bilateral ACA branches are within normal limits. Left MCA M1 segment and left MCA bifurcation are patent. The more anterior left M2 branch appears normal. There is moderate to severe irregularity and stenosis affecting the proximal few millimeters of the dominant posterior left M2 branch best seen on series 13, image 21. No left MCA branch occlusion is identified. Right MCA M1 segment is tortuous and patent. Right MCA bifurcation is patent. Right MCA branches are mildly  to moderately irregular. No right MCA branch occlusion identified. Venous sinuses: Not evaluated due to early contrast timing. Anatomic variants: Fetal type bilateral PCA origins. Review of the MIP images confirms the above findings IMPRESSION: 1. Positive for occlusion of the Left PCA, including occlusion of the distal Left Pcomm. 2. Positive also for moderate to severe irregularity and stenosis at the origin of the dominant posterior Left MCA M2 branch, but it is unclear whether this is related to chronic atherosclerosis or thromboembolic disease. 3. No other large vessel occlusion, but there is moderate to severe stenosis of the Basilar Artery distal 3rd, and there is moderate stenosis of the right ICA siphon due to calcified plaque. 4. Atherosclerosis of the aortic arch, cervical carotid and vertebral arteries, but no significant stenosis in the neck. 5. Cardiomegaly.  Calcified coronary artery atherosclerosis. Salient findings were communicated to Dr. Amada Jupiter at 12:20 pmon 2/8/2019by text page via the [facility] messaging system. Electronically Signed   By: Odessa Fleming M.D.   On: 05/14/2017 12:21   Ct Angio Neck W Or Wo Contrast  Result Date: 05/14/2017 CLINICAL DATA:  84 year old female code  stroke. Right facial droop and right side weakness. Last seen normal 2000 hr yesterday. EXAM: CT ANGIOGRAPHY HEAD AND NECK TECHNIQUE: Multidetector CT imaging of the head and neck was performed using the standard protocol during bolus administration of intravenous contrast. Multiplanar CT image reconstructions and MIPs were obtained to evaluate the vascular anatomy. Carotid stenosis measurements (when applicable) are obtained utilizing NASCET criteria, using the distal internal carotid diameter as the denominator. CONTRAST:  50 milliliters Isovue 370 COMPARISON:  Noncontrast head CT 1126 hr today. FINDINGS: CTA NECK Skeleton: Thoracic kyphoscoliosis. Cervical spine degeneration. Absent dentition. No acute osseous abnormality identified. Upper chest: Cardiomegaly. Calcified coronary artery atherosclerosis. Negative visible lung parenchyma. No superior mediastinal lymphadenopathy. Other neck: Negative. Small thyroid nodules which do not meet consensus criteria for thyroid ultrasound follow-up. No cervical lymphadenopathy. Aortic arch: Moderate soft and calcified plaque throughout the aortic arch and visible descending thoracic aorta. Calcified coronary artery atherosclerosis. 3 vessel arch configuration. Right carotid system: No brachiocephalic or right CCA origin stenosis despite mild plaque. Tortuous proximal right CCA. Soft and calcified plaque at the right carotid bifurcation and right ICA origin with less than 50 % stenosis with respect to the distal vessel of the proximal right ICA. Mildly tortuous mid cervical right ICA. Left carotid system: No left CCA origin stenosis. Tortuous proximal left CCA. Calcified plaque at the left carotid bifurcation with more bulky lateral left ICA origin and bulb mixed soft and calcified plaque. Still, less than 50 % stenosis with respect to the distal vessel. Tortuous mid cervical left ICA. Vertebral arteries: . Patent no proximal right subclavian right vertebral artery origin  artery stenosis without stenosis. Tortuous right V1 segment. Patent right vertebral artery to the skull base without focal stenosis. No proximal left subclavian artery stenosis despite plaque. Normal left vertebral artery origin. Tortuous left V1 segment. Irregularity of the left vertebral artery V2 segment but the vessel remains patent to the skull base without focal stenosis. CTA HEAD Posterior circulation: Patent distal vertebral arteries, the left is diminutive beyond the left PICA origin. The right PICA origin is patent. No distal vertebral artery stenosis. Patent vertebrobasilar junction. Diminutive basilar artery. There is moderate to severe irregularity in the distal 3rd of the basilar with short-segment moderate to severe stenosis of the vessel (series 9, image 106 and series 13, image 24. still, the distal basilar is patent. SCA  origins are patent. There are fetal type PCA origins. The left posterior communicating artery is occluded just before the junction with the left P1 segment, which is also occluded. The left P2 and P3 are largely occluded with minimal reconstituted enhancement (series 12, image 20). There is mild to moderate widespread irregularity in the right PCA and its branches which remain patent. Anterior circulation: Both ICA siphons are patent. There is moderate left and severe right ICA siphon calcified plaque. No left siphon stenosis. Left ophthalmic and posterior communicating artery origins are patent. On the right side there is mild to moderate distal cavernous and proximal supraclinoid ICA stenosis due to calcified plaque. Normal right ophthalmic and posterior communicating artery origins. Patent carotid termini. Patent MCA and ACA origins with only mild irregularity (series 13, image 20). The anterior communicating artery and bilateral ACA branches are within normal limits. Left MCA M1 segment and left MCA bifurcation are patent. The more anterior left M2 branch appears normal. There  is moderate to severe irregularity and stenosis affecting the proximal few millimeters of the dominant posterior left M2 branch best seen on series 13, image 21. No left MCA branch occlusion is identified. Right MCA M1 segment is tortuous and patent. Right MCA bifurcation is patent. Right MCA branches are mildly to moderately irregular. No right MCA branch occlusion identified. Venous sinuses: Not evaluated due to early contrast timing. Anatomic variants: Fetal type bilateral PCA origins. Review of the MIP images confirms the above findings IMPRESSION: 1. Positive for occlusion of the Left PCA, including occlusion of the distal Left Pcomm. 2. Positive also for moderate to severe irregularity and stenosis at the origin of the dominant posterior Left MCA M2 branch, but it is unclear whether this is related to chronic atherosclerosis or thromboembolic disease. 3. No other large vessel occlusion, but there is moderate to severe stenosis of the Basilar Artery distal 3rd, and there is moderate stenosis of the right ICA siphon due to calcified plaque. 4. Atherosclerosis of the aortic arch, cervical carotid and vertebral arteries, but no significant stenosis in the neck. 5. Cardiomegaly.  Calcified coronary artery atherosclerosis. Salient findings were communicated to Dr. Amada Jupiter at 12:20 pmon 2/8/2019by text page via the [facility] messaging system. Electronically Signed   By: Odessa Fleming M.D.   On: 05/14/2017 12:21   Ct Head Code Stroke Wo Contrast  Result Date: 05/14/2017 CLINICAL DATA:  Code stroke. 84 year old female with right facial droop and right side weakness. Last seen normal 2000 hr yesterday. EXAM: CT HEAD WITHOUT CONTRAST TECHNIQUE: Contiguous axial images were obtained from the base of the skull through the vertex without intravenous contrast. COMPARISON:  None. FINDINGS: Study is intermittently degraded by motion artifact. Brain: Patchy hypodensity compatible with cytotoxic edema in the left PCA territory  (series 3, image 14), but superimposed on chronic appearing inferior left occipital and posterior temporal lobe encephalomalacia. There is asymmetric patchy hypodensity in the left thalamus. Superimposed confluent bilateral widespread cerebral white matter hypodensity. Small foci of age indeterminate hypodensity in the right caudate. The other deep gray matter nuclei, and the brainstem gray-white matter differentiation is within normal limits. Age indeterminate patchy hypodensity in the central left cerebellum. The right cerebellum appears within normal limits. No other cytotoxic edema identified. No acute intracranial hemorrhage identified. No midline shift, mass effect, or evidence of intracranial mass lesion. No ventriculomegaly. Vascular: Calcified atherosclerosis at the skull base. No suspicious intracranial vascular hyperdensity. Skull: No acute osseous abnormality identified. Sinuses/Orbits: Mild ethmoid sinus mucosal thickening, otherwise clear.  Other: No acute orbit or scalp soft tissue finding identified. ASPECTS [facility]([place] Stroke Program Early CT Score) Total score (0-10 with 10 being normal): 10, although the left PCA and left cerebellar artery territories are abnormal. IMPRESSION: 1. Subacute on chronic appearing infarct in the left PCA territory, including evidence of left thalamic involvement. 2. Superimposed age indeterminate ischemia in the left cerebellum. 3. No associated acute intracranial hemorrhage or mass effect. 4. No other cytotoxic edema identified.  ASPECTS is 10. 5. Advanced nonspecific cerebral white matter disease. 6. These results were communicated to Dr. Amada JupiterKirkpatrick at 11:40 amon 2/8/2019by text page via the [facility] messaging system. Electronically Signed   By: Odessa FlemingH  Hall M.D.   On: 05/14/2017 11:40    EKG: Independently reviewed. Atrial flutter with variable rate at approximately 100. No acute ST changes. Mild intraventricular conduction delay.   Assessment/Plan:   Active  Problems:   CVA (cerebral vascular accident) [facility](HCC)  CVA 84 year old patient with new onset stroke that seems quite debilitating. At present she has a GCS of 10. Main focus right now will be to avoid iatrogenesis. We'll keep head of bed up. Nothing by mouth. Avoid falls. Gentle hydration. Prognosis was apparently discussed between ED physician and patient's son. Patient's son is well aware of the grave prognosis and patient's end-of-life preferences were elicited. Patient is DO NOT RESUSCITATE.  Will request PT, OT, speech therapy to see patient. Echocardiogram ordered. Aspirin per rectum has been ordered Hold all antihypertensives for now to allow for permissive hypertension When necessary hydralazine for systolic blood pressure greater than 220  ATRIAL FLUTTER NEW ONSET Given new onset atrial flutter with stroke patient will need full dose anticoagulation in addition to antiplatelet therapy.  However data suggest worse outcomes with anticoagulation within the first 48 hours after acute stroke. Can start anticoagulation in 48 hours as warranted  HTN Antihypertensives are being held as patient is nothing by mouth and to allow for permissive hypertension When necessary hydralazine ordered for systolic blood pressure of 220  RENAL INSUFFICIENCY Gentle maintenance hydration with normal saline, recheck in the morning    Patient is DO NOT RESUSCITATE   Other information:   DVT prophylaxis: Lovenox ordered. Code Status: DO NOT RESUSCITATE. Family Communication: Patient's son was at bedside Disposition Plan: Nursing home/to be determined Consults called: Neurology Admission status: Inpatient   The medical decision making on this patient was of high complexity and the patient is at high risk for clinical deterioration, therefore this is a level 3 visit.    Horatio PelSrobona Orma Flamingublu Liannah Yarbough [facility] Pager [phone] Cell: [phone]   If 7PM-7AM, please contact  night-coverage [email or web address] Password [email or web address] 05/14/2017, 1:52 PM

## 2017-05-14 NOTE — Code Documentation (Addendum)
84[facility].  [facility] where she was found at 0945 this morning with right sided weakness and inability to speak.  LKW 2000 last night.  Code stroke activated by EMS for LVO+ scale.  Stroke team at the bedside on patient arrival.  Labs drawn and patient cleared for CT by Dr. Patria Maneampos.  Patient to CT with team.  CT completed followed by CTA.  NIHSS 24, see documentation for details and code stroke times.  Patient with global aphasia and right hemiplegia on exam.  Patient is outside the window for tPA.  Patient is not a candidate for endovascular intervention.  Bedside handoff with ED RN Clydie BraunKaren.

## 2017-05-14 NOTE — ED Notes (Signed)
Pt received from trauma b  , pt sleeping

## 2017-05-14 NOTE — ED Notes (Signed)
Son -- Ginette OttoBodo Vining -- [phone] -- PLEASE NOTIFY WHEN PT MOVES TO ANOTHER ROOM.

## 2017-05-14 NOTE — ED Notes (Signed)
EKG given to EDP.  

## 2017-05-14 NOTE — Progress Notes (Signed)
Pt arrived to 3W at 1755.

## 2017-05-14 NOTE — ED Provider Notes (Signed)
[facility] EMERGENCY DEPARTMENT Provider Note   CSN: [id number] Arrival date & time: 05/14/17  1123     History   Chief Complaint Chief Complaint  Patient presents with  . Code Stroke   Level 5 caveat: Altered mental status/stroke symptoms HPI Sharon Ryan is a 84 y.o. female.  HPI Patient is a 84 year old female presents to the emergency department as a code stroke.  She was last seen normal last night approximately 8 PM.  She is found this morning to be weak on the right side and a phasic.  EMS was contacted and the patient was brought to the ER as a code stroke.  No prior history of documented atrial fibrillation or prior stroke.  Presents now in A. fib with significant weakness of the right side.  She is a phasic.  Past Medical History:  Diagnosis Date  . Hypertension     Patient Active Problem List   Diagnosis Date Noted  . Closed fracture of left proximal tibia 01/30/2017  . AKI (acute kidney injury) (HCC) 01/30/2017  . Normal anion gap metabolic acidosis 01/30/2017  . Tibial fracture 01/30/2017    History reviewed. No pertinent surgical history.  OB History    No data available       Home Medications    Prior to Admission medications   Medication Sig Start Date End Date Taking? Authorizing Provider  amLODipine (NORVASC) 5 MG tablet Take 5 mg by mouth daily.    [provider]  chlorthalidone (HYGROTON) 25 MG tablet Take 25 mg by mouth daily.    [provider]  cloNIDine (CATAPRES) 0.2 MG tablet Take 0.2 mg by mouth at bedtime.    [provider]  metoprolol succinate (TOPROL-XL) 50 MG 24 hr tablet Take 50 mg by mouth daily. Take with or immediately following a meal.    [provider]  oxyCODONE (OXY IR/ROXICODONE) 5 MG immediate release tablet Take 1 tablet (5 mg total) by mouth every 4 (four) hours as needed for moderate pain. 02/01/17   Marinda Elk, MD  polyethylene glycol [facility]  Ethelene Hal) packet Take 17 g by mouth daily. 02/01/17   Marinda Elk, MD  ramipril (ALTACE) 5 MG capsule Take 5 mg by mouth daily.    [provider]    Family History Family History  Problem Relation Age of Onset  . Heart attack Father     Social History Social History   Tobacco Use  . Smoking status: Never Smoker  . Smokeless tobacco: Never Used  Substance Use Topics  . Alcohol use: No  . Drug use: No     Allergies   Patient has no known allergies.   Review of Systems Review of Systems  Unable to perform ROS: Patient nonverbal     Physical Exam Updated Vital Signs BP (!) 175/101   Pulse 72   Temp 97.9 F (36.6 C) (Temporal)   Resp 18   Ht 5\' 2"  (1.575 m)   Wt 52.7 kg (116 lb 2.9 oz)   SpO2 97%   BMI 21.25 kg/m   Physical Exam  Constitutional: She appears well-developed and well-nourished. No distress.  HENT:  Head: Normocephalic and atraumatic.  Eyes: EOM are normal.  Neck: Normal range of motion.  Cardiovascular: Normal rate, regular rhythm and normal heart sounds.  Pulmonary/Chest: Effort normal and breath sounds normal.  Abdominal: Soft. She exhibits no distension. There is no tenderness.  Musculoskeletal: Normal range of motion.  Neurological:  Opens eyes to voice.  Moves left side.  Weakness of the right side.  Right-sided facial droop.  Skin: Skin is warm and dry.  Psychiatric: She has a normal mood and affect. Judgment normal.  Nursing note and vitals reviewed.    ED Treatments / Results  Labs (all labs ordered are listed, but only abnormal results are displayed) Labs Reviewed  CBC - Abnormal; Notable for the following components:      Result Value   RBC 3.52 (*)    Hemoglobin 9.5 (*)    HCT 30.4 (*)    RDW 16.1 (*)    All other components within normal limits  COMPREHENSIVE METABOLIC PANEL - Abnormal; Notable for the following components:   CO2 19 (*)    Glucose, Bld 128 (*)    BUN 38 (*)    Creatinine, Ser 1.80  (*)    Albumin 3.4 (*)    GFR calc non Af Amer 23 (*)    GFR calc Af Amer 26 (*)    All other components within normal limits  I-STAT CHEM 8, ED - Abnormal; Notable for the following components:   BUN 33 (*)    Creatinine, Ser 1.80 (*)    Glucose, Bld 125 (*)    TCO2 20 (*)    Hemoglobin 10.2 (*)    HCT 30.0 (*)    All other components within normal limits  PROTIME-INR  APTT  DIFFERENTIAL  I-STAT TROPONIN, ED    EKG  EKG Interpretation  Date/Time:  Friday May 14 2017 11:56:27 EST Ventricular Rate:  97 PR Interval:    QRS Duration: 87 QT Interval:  390 QTC Calculation: 498 R Axis:   135 Text Interpretation:  Atrial fibrillation Left posterior fascicular block Low voltage, precordial leads Borderline repolarization abnormality Borderline prolonged QT interval No old tracing to compare Confirmed by Azalia Bilis ([id number]) on 05/14/2017 1:04:57 PM       Radiology Ct Angio Head W Or Wo Contrast  Result Date: 05/14/2017 CLINICAL DATA:  84 year old female code stroke. Right facial droop and right side weakness. Last seen normal 2000 hr yesterday. EXAM: CT ANGIOGRAPHY HEAD AND NECK TECHNIQUE: Multidetector CT imaging of the head and neck was performed using the standard protocol during bolus administration of intravenous contrast. Multiplanar CT image reconstructions and MIPs were obtained to evaluate the vascular anatomy. Carotid stenosis measurements (when applicable) are obtained utilizing NASCET criteria, using the distal internal carotid diameter as the denominator. CONTRAST:  50 milliliters Isovue 370 COMPARISON:  Noncontrast head CT 1126 hr today. FINDINGS: CTA NECK Skeleton: Thoracic kyphoscoliosis. Cervical spine degeneration. Absent dentition. No acute osseous abnormality identified. Upper chest: Cardiomegaly. Calcified coronary artery atherosclerosis. Negative visible lung parenchyma. No superior mediastinal lymphadenopathy. Other neck: Negative. Small thyroid nodules which do  not meet consensus criteria for thyroid ultrasound follow-up. No cervical lymphadenopathy. Aortic arch: Moderate soft and calcified plaque throughout the aortic arch and visible descending thoracic aorta. Calcified coronary artery atherosclerosis. 3 vessel arch configuration. Right carotid system: No brachiocephalic or right CCA origin stenosis despite mild plaque. Tortuous proximal right CCA. Soft and calcified plaque at the right carotid bifurcation and right ICA origin with less than 50 % stenosis with respect to the distal vessel of the proximal right ICA. Mildly tortuous mid cervical right ICA. Left carotid system: No left CCA origin stenosis. Tortuous proximal left CCA. Calcified plaque at the left carotid bifurcation with more bulky lateral left ICA origin and bulb mixed soft and calcified plaque. Still, less than  50 % stenosis with respect to the distal vessel. Tortuous mid cervical left ICA. Vertebral arteries: . Patent no proximal right subclavian right vertebral artery origin artery stenosis without stenosis. Tortuous right V1 segment. Patent right vertebral artery to the skull base without focal stenosis. No proximal left subclavian artery stenosis despite plaque. Normal left vertebral artery origin. Tortuous left V1 segment. Irregularity of the left vertebral artery V2 segment but the vessel remains patent to the skull base without focal stenosis. CTA HEAD Posterior circulation: Patent distal vertebral arteries, the left is diminutive beyond the left PICA origin. The right PICA origin is patent. No distal vertebral artery stenosis. Patent vertebrobasilar junction. Diminutive basilar artery. There is moderate to severe irregularity in the distal 3rd of the basilar with short-segment moderate to severe stenosis of the vessel (series 9, image 106 and series 13, image 24. still, the distal basilar is patent. SCA origins are patent. There are fetal type PCA origins. The left posterior communicating artery is  occluded just before the junction with the left P1 segment, which is also occluded. The left P2 and P3 are largely occluded with minimal reconstituted enhancement (series 12, image 20). There is mild to moderate widespread irregularity in the right PCA and its branches which remain patent. Anterior circulation: Both ICA siphons are patent. There is moderate left and severe right ICA siphon calcified plaque. No left siphon stenosis. Left ophthalmic and posterior communicating artery origins are patent. On the right side there is mild to moderate distal cavernous and proximal supraclinoid ICA stenosis due to calcified plaque. Normal right ophthalmic and posterior communicating artery origins. Patent carotid termini. Patent MCA and ACA origins with only mild irregularity (series 13, image 20). The anterior communicating artery and bilateral ACA branches are within normal limits. Left MCA M1 segment and left MCA bifurcation are patent. The more anterior left M2 branch appears normal. There is moderate to severe irregularity and stenosis affecting the proximal few millimeters of the dominant posterior left M2 branch best seen on series 13, image 21. No left MCA branch occlusion is identified. Right MCA M1 segment is tortuous and patent. Right MCA bifurcation is patent. Right MCA branches are mildly to moderately irregular. No right MCA branch occlusion identified. Venous sinuses: Not evaluated due to early contrast timing. Anatomic variants: Fetal type bilateral PCA origins. Review of the MIP images confirms the above findings IMPRESSION: 1. Positive for occlusion of the Left PCA, including occlusion of the distal Left Pcomm. 2. Positive also for moderate to severe irregularity and stenosis at the origin of the dominant posterior Left MCA M2 branch, but it is unclear whether this is related to chronic atherosclerosis or thromboembolic disease. 3. No other large vessel occlusion, but there is moderate to severe stenosis  of the Basilar Artery distal 3rd, and there is moderate stenosis of the right ICA siphon due to calcified plaque. 4. Atherosclerosis of the aortic arch, cervical carotid and vertebral arteries, but no significant stenosis in the neck. 5. Cardiomegaly.  Calcified coronary artery atherosclerosis. Salient findings were communicated to Dr. Amada Jupiter at 12:20 pmon 2/8/2019by text page via the [facility] messaging system. Electronically Signed   By: Odessa Fleming M.D.   On: 05/14/2017 12:21   Ct Angio Neck W Or Wo Contrast  Result Date: 05/14/2017 CLINICAL DATA:  84 year old female code stroke. Right facial droop and right side weakness. Last seen normal 2000 hr yesterday. EXAM: CT ANGIOGRAPHY HEAD AND NECK TECHNIQUE: Multidetector CT imaging of the head and neck was performed  using the standard protocol during bolus administration of intravenous contrast. Multiplanar CT image reconstructions and MIPs were obtained to evaluate the vascular anatomy. Carotid stenosis measurements (when applicable) are obtained utilizing NASCET criteria, using the distal internal carotid diameter as the denominator. CONTRAST:  50 milliliters Isovue 370 COMPARISON:  Noncontrast head CT 1126 hr today. FINDINGS: CTA NECK Skeleton: Thoracic kyphoscoliosis. Cervical spine degeneration. Absent dentition. No acute osseous abnormality identified. Upper chest: Cardiomegaly. Calcified coronary artery atherosclerosis. Negative visible lung parenchyma. No superior mediastinal lymphadenopathy. Other neck: Negative. Small thyroid nodules which do not meet consensus criteria for thyroid ultrasound follow-up. No cervical lymphadenopathy. Aortic arch: Moderate soft and calcified plaque throughout the aortic arch and visible descending thoracic aorta. Calcified coronary artery atherosclerosis. 3 vessel arch configuration. Right carotid system: No brachiocephalic or right CCA origin stenosis despite mild plaque. Tortuous proximal right CCA. Soft and calcified plaque  at the right carotid bifurcation and right ICA origin with less than 50 % stenosis with respect to the distal vessel of the proximal right ICA. Mildly tortuous mid cervical right ICA. Left carotid system: No left CCA origin stenosis. Tortuous proximal left CCA. Calcified plaque at the left carotid bifurcation with more bulky lateral left ICA origin and bulb mixed soft and calcified plaque. Still, less than 50 % stenosis with respect to the distal vessel. Tortuous mid cervical left ICA. Vertebral arteries: . Patent no proximal right subclavian right vertebral artery origin artery stenosis without stenosis. Tortuous right V1 segment. Patent right vertebral artery to the skull base without focal stenosis. No proximal left subclavian artery stenosis despite plaque. Normal left vertebral artery origin. Tortuous left V1 segment. Irregularity of the left vertebral artery V2 segment but the vessel remains patent to the skull base without focal stenosis. CTA HEAD Posterior circulation: Patent distal vertebral arteries, the left is diminutive beyond the left PICA origin. The right PICA origin is patent. No distal vertebral artery stenosis. Patent vertebrobasilar junction. Diminutive basilar artery. There is moderate to severe irregularity in the distal 3rd of the basilar with short-segment moderate to severe stenosis of the vessel (series 9, image 106 and series 13, image 24. still, the distal basilar is patent. SCA origins are patent. There are fetal type PCA origins. The left posterior communicating artery is occluded just before the junction with the left P1 segment, which is also occluded. The left P2 and P3 are largely occluded with minimal reconstituted enhancement (series 12, image 20). There is mild to moderate widespread irregularity in the right PCA and its branches which remain patent. Anterior circulation: Both ICA siphons are patent. There is moderate left and severe right ICA siphon calcified plaque. No left  siphon stenosis. Left ophthalmic and posterior communicating artery origins are patent. On the right side there is mild to moderate distal cavernous and proximal supraclinoid ICA stenosis due to calcified plaque. Normal right ophthalmic and posterior communicating artery origins. Patent carotid termini. Patent MCA and ACA origins with only mild irregularity (series 13, image 20). The anterior communicating artery and bilateral ACA branches are within normal limits. Left MCA M1 segment and left MCA bifurcation are patent. The more anterior left M2 branch appears normal. There is moderate to severe irregularity and stenosis affecting the proximal few millimeters of the dominant posterior left M2 branch best seen on series 13, image 21. No left MCA branch occlusion is identified. Right MCA M1 segment is tortuous and patent. Right MCA bifurcation is patent. Right MCA branches are mildly to moderately irregular. No right MCA  branch occlusion identified. Venous sinuses: Not evaluated due to early contrast timing. Anatomic variants: Fetal type bilateral PCA origins. Review of the MIP images confirms the above findings IMPRESSION: 1. Positive for occlusion of the Left PCA, including occlusion of the distal Left Pcomm. 2. Positive also for moderate to severe irregularity and stenosis at the origin of the dominant posterior Left MCA M2 branch, but it is unclear whether this is related to chronic atherosclerosis or thromboembolic disease. 3. No other large vessel occlusion, but there is moderate to severe stenosis of the Basilar Artery distal 3rd, and there is moderate stenosis of the right ICA siphon due to calcified plaque. 4. Atherosclerosis of the aortic arch, cervical carotid and vertebral arteries, but no significant stenosis in the neck. 5. Cardiomegaly.  Calcified coronary artery atherosclerosis. Salient findings were communicated to Dr. Amada Jupiter at 12:20 pmon 2/8/2019by text page via the [facility] messaging system.  Electronically Signed   By: Odessa Fleming M.D.   On: 05/14/2017 12:21   Ct Head Code Stroke Wo Contrast  Result Date: 05/14/2017 CLINICAL DATA:  Code stroke. 84 year old female with right facial droop and right side weakness. Last seen normal 2000 hr yesterday. EXAM: CT HEAD WITHOUT CONTRAST TECHNIQUE: Contiguous axial images were obtained from the base of the skull through the vertex without intravenous contrast. COMPARISON:  None. FINDINGS: Study is intermittently degraded by motion artifact. Brain: Patchy hypodensity compatible with cytotoxic edema in the left PCA territory (series 3, image 14), but superimposed on chronic appearing inferior left occipital and posterior temporal lobe encephalomalacia. There is asymmetric patchy hypodensity in the left thalamus. Superimposed confluent bilateral widespread cerebral white matter hypodensity. Small foci of age indeterminate hypodensity in the right caudate. The other deep gray matter nuclei, and the brainstem gray-white matter differentiation is within normal limits. Age indeterminate patchy hypodensity in the central left cerebellum. The right cerebellum appears within normal limits. No other cytotoxic edema identified. No acute intracranial hemorrhage identified. No midline shift, mass effect, or evidence of intracranial mass lesion. No ventriculomegaly. Vascular: Calcified atherosclerosis at the skull base. No suspicious intracranial vascular hyperdensity. Skull: No acute osseous abnormality identified. Sinuses/Orbits: Mild ethmoid sinus mucosal thickening, otherwise clear. Other: No acute orbit or scalp soft tissue finding identified. ASPECTS [facility] Stroke Program Early CT Score) Total score (0-10 with 10 being normal): 10, although the left PCA and left cerebellar artery territories are abnormal. IMPRESSION: 1. Subacute on chronic appearing infarct in the left PCA territory, including evidence of left thalamic involvement. 2. Superimposed age indeterminate  ischemia in the left cerebellum. 3. No associated acute intracranial hemorrhage or mass effect. 4. No other cytotoxic edema identified.  ASPECTS is 10. 5. Advanced nonspecific cerebral white matter disease. 6. These results were communicated to Dr. Amada Jupiter at 11:40 amon 2/8/2019by text page via the [facility] messaging system. Electronically Signed   By: Odessa Fleming M.D.   On: 05/14/2017 11:40    Procedures Procedures (including critical care time)  Medications Ordered in ED Medications  iopamidol (ISOVUE-370) 76 % injection (not administered)     Initial Impression / Assessment and Plan / ED Course  I have reviewed the triage vital signs and the nursing notes.  Pertinent labs & imaging results that were available during my care of the patient were reviewed by me and considered in my medical decision making (see chart for details).     Code stroke.  Suspected stroke.  No large vessel occlusion.  Noted to be subacute on head CT.  Patient is  not a candidate for TPA nor she a candidate for interventional radiology involvement per neurology.  Please see neurology consultation for complete details.  Patient will be admitted for stroke workup, PT, OT.  Final Clinical Impressions(s) / ED Diagnoses   Final diagnoses:  None    ED Discharge Orders    None       Azalia Bilisampos, Cinthya Bors, MD 05/14/17 [phone]

## 2017-05-15 ENCOUNTER — Inpatient Hospital Stay (HOSPITAL_COMMUNITY): Payer: Medicare Other

## 2017-05-15 ENCOUNTER — Encounter (HOSPITAL_COMMUNITY): Payer: Medicare Other

## 2017-05-15 DIAGNOSIS — I361 Nonrheumatic tricuspid (valve) insufficiency: Secondary | ICD-10-CM

## 2017-05-15 LAB — MRSA PCR SCREENING: MRSA BY PCR: NEGATIVE

## 2017-05-15 LAB — LIPID PANEL
Cholesterol: 175 mg/dL (ref 0–200)
HDL: 45 mg/dL (ref >40)
LDL CALC: 112 mg/dL — AB (ref 0–99)
TRIGLYCERIDES: 90 mg/dL (ref <150)
Total CHOL/HDL Ratio: 3.9 RATIO
VLDL: 18 mg/dL (ref 0–40)

## 2017-05-15 LAB — HEMOGLOBIN A1C
HEMOGLOBIN A1C: 6.4 % — AB (ref 4.8–5.6)
Mean Plasma Glucose: 136.98 mg/dL

## 2017-05-15 MED ORDER — LORAZEPAM 2 MG/ML IJ SOLN
1.0000 mg | INTRAMUSCULAR | Status: DC | PRN
  Administered 2017-05-15: 1 mg via INTRAVENOUS
  Filled 2017-05-15: qty 1

## 2017-05-15 NOTE — Evaluation (Signed)
Occupational Therapy Evaluation Patient Details Name: Sharon Ryan MRN: [id number] DOB: 09/18/1918 Today's Date: 05/15/2017    History of Present Illness  84 y.o. female who is admitted for new onset right-sided weakness and new onset A. fib aphasia with altered mental status. PMH includes mechanical fall with tibial fx in Oct 2018.   Clinical Impression   Pt admitted with the above diagnoses and presents with below problem list. Pt will benefit from continued acute OT to address the below listed deficits and maximize independence with basic ADLs prior to d/c back to SNF. Pt unable to provide PLOF information. Per chart review was independent until Oct 2018 when she fell and sustained a tibial fx. Unclear what recent level of assist has been. Pt is currently total care for ADLs and dependent with transfers. Low arousal level, nonverbal, and unable to follow commands. Feel pt may benefit from palliative consult. OT to follow pt acutely with goal of increasing functional independence with ADLs and transfers to reduce burden of care.       Follow Up Recommendations  SNF;Supervision/Assistance - 24 hour    Equipment Recommendations  Other (comment)(defer to next venue)    Recommendations for Other Services Other (comment)(would likely benefit from palliative consult)     Precautions / Restrictions Precautions Precautions: Fall Precaution Comments: low level of arousal, nonverbal Restrictions Weight Bearing Restrictions: No      Mobility Bed Mobility Overal bed mobility: Needs Assistance Bed Mobility: Supine to Sit;Sit to Supine     Supine to sit: Total assist;+2 for physical assistance Sit to supine: Total assist;+2 for physical assistance   General bed mobility comments: assist for all aspects of bed mobility  Transfers                 General transfer comment: not attempted, zero sitting balance    Balance Overall balance assessment: History of Falls;Needs  assistance Sitting-balance support: Feet supported Sitting balance-Leahy Scale: Zero Sitting balance - Comments: max A +2 to sit EOB 2 minutes                                   ADL either performed or assessed with clinical judgement   ADL Overall ADL's : Needs assistance/impaired Eating/Feeding: NPO   Grooming: Total assistance   Upper Body Bathing: Total assistance   Lower Body Bathing: Total assistance   Upper Body Dressing : Total assistance   Lower Body Dressing: Total assistance                 General ADL Comments: Heavy right and posterior lean. Max A +2 to sit EOB about 2 minutes. Bed level ADLs.     Vision   Additional Comments: Noted that L eyelid seems to opens minimally compared to R eye.     Perception     Praxis      Pertinent Vitals/Pain Pain Assessment: Faces Faces Pain Scale: Hurts little more Pain Location: grimacing with bed mobility Pain Descriptors / Indicators: Grimacing Pain Intervention(s): Monitored during session;Repositioned;Limited activity within patient's tolerance     Hand Dominance     Extremity/Trunk Assessment Upper Extremity Assessment Upper Extremity Assessment: Generalized weakness;Difficult to assess due to impaired cognition;RUE deficits/detail RUE Deficits / Details: impaired ability to use RUE to support self sitting EOB.    Lower Extremity Assessment Lower Extremity Assessment: Defer to PT evaluation   Cervical / Trunk Assessment Cervical / Trunk Assessment:  Kyphotic   Communication Communication Communication: Expressive difficulties;Receptive difficulties;Other (comment)(nonverbal, low arousal level)   Cognition Arousal/Alertness: Lethargic Behavior During Therapy: Flat affect Overall Cognitive Status: No family/caregiver present to determine baseline cognitive functioning                                 General Comments: low arousal level, does not follow commands, reacts to  localized painful stimuli. Briefly opens eyes sometimes when short, loud command given.   General Comments       Exercises     Shoulder Instructions      Home Living Family/patient expects to be discharged to:: Skilled nursing facility                                        Prior Functioning/Environment          Comments: per chart review was independent until Oct 2018 when she fell and sustained a tibial fx. Unclear what recent level of assist has been.         OT Problem List: Impaired balance (sitting and/or standing);Decreased activity tolerance;Decreased strength;Decreased coordination;Decreased cognition;Decreased safety awareness;Decreased knowledge of use of DME or AE;Decreased knowledge of precautions;Cardiopulmonary status limiting activity;Impaired tone;Impaired UE functional use;Pain      OT Treatment/Interventions: Self-care/ADL training;DME and/or AE instruction;Neuromuscular education;Therapeutic activities;Cognitive remediation/compensation;Patient/family education;Balance training    OT Goals(Current goals can be found in the care plan section) Acute Rehab OT Goals OT Goal Formulation: Patient unable to participate in goal setting Time For Goal Achievement: 05/29/17 Potential to Achieve Goals: Fair ADL Goals Additional ADL Goal #1: Pt will complete bed mobility with mod +2 assist to prepare for functional transfers and EOB ADLs. Additional ADL Goal #2: Pt will sit EOB 1 minute with mod +2 A.  OT Frequency: Min 2X/week   Barriers to D/C:            Co-evaluation PT/OT/SLP Co-Evaluation/Treatment: Yes Reason for Co-Treatment: Complexity of the patient's impairments (multi-system involvement);Necessary to address cognition/behavior during functional activity;For patient/therapist safety   OT goals addressed during session: ADL's and self-care      AM-PAC PT "6 Clicks" Daily Activity     Outcome Measure Help from another person eating  meals?: Total Help from another person taking care of personal grooming?: Total Help from another person toileting, which includes using toliet, bedpan, or urinal?: Total Help from another person bathing (including washing, rinsing, drying)?: Total Help from another person to put on and taking off regular upper body clothing?: Total Help from another person to put on and taking off regular lower body clothing?: Total 6 Click Score: 6   End of Session    Activity Tolerance: Patient limited by lethargy Patient left: in bed;with call bell/phone within reach;with bed alarm set;with SCD's reapplied  OT Visit Diagnosis: Muscle weakness (generalized) (M62.81);Other abnormalities of gait and mobility (R26.89);History of falling (Z91.81);Other symptoms and signs involving cognitive function;Cognitive communication deficit (R41.841);Pain                Time: [id number] OT Time Calculation (min): 14 min Charges:  OT General Charges $OT Visit: 1 Visit OT Evaluation $OT Eval Low Complexity: 1 Low G-Codes:       Pilar GrammesMathews, Tyra Michelle H 05/15/2017, 11:14 AM

## 2017-05-15 NOTE — Evaluation (Signed)
Physical Therapy Evaluation Patient Details Name: Sharon Ryan MRN: [id number] DOB: [birth year] Today's Date: 05/15/2017   History of Present Illness   Pt is a 84 y.o. female who is admitted from [facility] nursing facility with new onset right-sided weakness, new onset A. fib and aphasia with altered mental status. Neuro was consulted and determined a subacute on chronic appearing left PCA infarct likely cause of her presentation. PMH includes mechanical fall with tibial fx in Oct 2018.    Clinical Impression  Pt presented supine in bed with HOB elevated, asleep and with difficulty increasing arousal level throughout. No family or caregivers present to provide any information regarding pt's PLOF. Pt very lethargic throughout and required total A x2 for bed mobility. Pt would continue to benefit from skilled physical therapy services at this time while admitted and after d/c to address the below listed limitations in order to improve overall safety and independence with functional mobility.      Follow Up Recommendations SNF    Equipment Recommendations  None recommended by PT    Recommendations for Other Services Other (comment)(Palliative consult)     Precautions / Restrictions Precautions Precautions: Fall Precaution Comments: low level of arousal, nonverbal Restrictions Weight Bearing Restrictions: No      Mobility  Bed Mobility Overal bed mobility: Needs Assistance Bed Mobility: Supine to Sit;Sit to Supine     Supine to sit: Total assist;+2 for physical assistance Sit to supine: Total assist;+2 for physical assistance   General bed mobility comments: assist for all aspects of bed mobility  Transfers                 General transfer comment: not attempted, zero sitting balance and pt very lethargic  Ambulation/Gait                Stairs            Wheelchair Mobility    Modified Rankin (Stroke Patients Only)       Balance Overall  balance assessment: History of Falls;Needs assistance Sitting-balance support: Feet supported Sitting balance-Leahy Scale: Zero Sitting balance - Comments: max A +2 to sit EOB 2 minutes                                     Pertinent Vitals/Pain Pain Assessment: Faces Faces Pain Scale: Hurts little more Pain Location: grimacing with bed mobility Pain Descriptors / Indicators: Grimacing Pain Intervention(s): Monitored during session;Repositioned    Home Living Family/patient expects to be discharged to:: Skilled nursing facility                      Prior Function           Comments: per chart review was independent until Oct 2018 when she fell and sustained a tibial fx. Unclear what recent level of assist has been as pt was unable to provide any information and no family/caregivers present     Hand Dominance        Extremity/Trunk Assessment   Upper Extremity Assessment Upper Extremity Assessment: Defer to OT evaluation RUE Deficits / Details: impaired ability to use RUE to support self sitting EOB.     Lower Extremity Assessment Lower Extremity Assessment: Difficult to assess due to impaired cognition;RLE deficits/detail RLE Deficits / Details: pt with preference for maintaining R LE in hip flexion, hip IR, hip adduction and knee flexion. pt with flexion  contracture at knee and unable to passively achieve knee extension    Cervical / Trunk Assessment Cervical / Trunk Assessment: Kyphotic  Communication   Communication: Expressive difficulties;Receptive difficulties  Cognition Arousal/Alertness: Lethargic Behavior During Therapy: Flat affect Overall Cognitive Status: Difficult to assess                                 General Comments: low arousal level, does not follow commands, reacts to localized painful stimuli. Briefly opens eyes sometimes when short, loud command given.      General Comments      Exercises      Assessment/Plan    PT Assessment Patient needs continued PT services  PT Problem List Decreased strength;Decreased range of motion;Decreased activity tolerance;Decreased balance;Decreased mobility;Decreased coordination;Decreased cognition;Decreased knowledge of use of DME;Decreased safety awareness;Decreased knowledge of precautions       PT Treatment Interventions DME instruction;Stair training;Gait training;Functional mobility training;Therapeutic activities;Therapeutic exercise;Balance training;Neuromuscular re-education;Cognitive remediation;Patient/family education    PT Goals (Current goals can be found in the Care Plan section)  Acute Rehab PT Goals Patient Stated Goal: unable to state PT Goal Formulation: Patient unable to participate in goal setting Time For Goal Achievement: 05/29/17 Potential to Achieve Goals: Fair    Frequency Min 3X/week   Barriers to discharge        Co-evaluation PT/OT/SLP Co-Evaluation/Treatment: Yes Reason for Co-Treatment: Complexity of the patient's impairments (multi-system involvement);Necessary to address cognition/behavior during functional activity;For patient/therapist safety;To address functional/ADL transfers PT goals addressed during session: Mobility/safety with mobility;Balance;Strengthening/ROM OT goals addressed during session: ADL's and self-care       AM-PAC PT "6 Clicks" Daily Activity  Outcome Measure Difficulty turning over in bed (including adjusting bedclothes, sheets and blankets)?: Unable Difficulty moving from lying on back to sitting on the side of the bed? : Unable Difficulty sitting down on and standing up from a chair with arms (e.g., wheelchair, bedside commode, etc,.)?: Unable Help needed moving to and from a bed to chair (including a wheelchair)?: Total Help needed walking in hospital room?: Total Help needed climbing 3-5 steps with a railing? : Total 6 Click Score: 6    End of Session   Activity  Tolerance: Patient limited by lethargy Patient left: in bed;with call bell/phone within reach;with bed alarm set Nurse Communication: Mobility status PT Visit Diagnosis: Other abnormalities of gait and mobility (R26.89);Other symptoms and signs involving the nervous system (Z61.096(R29.898)    Time: [id number] PT Time Calculation (min) (ACUTE ONLY): 14 min   Charges:   PT Evaluation $PT Eval Moderate Complexity: 1 Mod     PT G Codes:        LitchfieldJennifer Goldye Tourangeau, PT, DPT [phone]   Alessandra BevelsJennifer M Charene Mccallister 05/15/2017, 11:41 AM

## 2017-05-15 NOTE — Progress Notes (Signed)
  Echocardiogram 2D Echocardiogram has been performed.  Pieter PartridgeBrooke S Jadis Pitter 05/15/2017, 3:24 PM

## 2017-05-15 NOTE — Progress Notes (Signed)
STROKE TEAM PROGRESS NOTE   HISTORY OF PRESENT ILLNESS (per record) Sharon Ryan is an 84 y.o. female brought by EMS as code stroke from a nursing home.  She was last seen normal at 8:00 last night.  Patient was not seen until 930 this morning.  When seen she was noted to be aphasic, moaning, right facial droop, right sided weakness.  [facility].  In route she is noted to be in new atrial flutter.  Initial CT scan did not show any blood.  CT was followed by CTA of head and neck.  No large vessel occlusion in the M1 or M2 was noted.  Unfortunately patient was not a TPA candidate as she was out of the window.  And she was not an IR candidate due to only large vessel occluded being in the territory of the already visible stroke.  Date last known well: Date: 05/13/2017 Time last known well: Time: 20:00 tPA Given: No: out of window NIH stroke scale 23  Modified Rankin: Rankin Score=3   SUBJECTIVE (INTERVAL HISTORY) The patient's caregiver and the patient's son were at the bedside. The patient was recently at a nursing facility for a tibial fracture. She is now totally plegic on the right, lethargic, poorly responsive, and able to follow only simple commands. She made no attempts to speak. Dr. Pearlean BrownieSethi discussed return to a nursing facility versus hospice. The patient's son is aware of the severity of the stroke. He is not opposed to hospice if the patient does not show improvement.   OBJECTIVE Temp:  [97.8 F (36.6 C)-98.4 F (36.9 C)] 97.8 F (36.6 C) (02/09 0617) Pulse Rate:  [64-94] 71 (02/09 0617) Cardiac Rhythm: Atrial flutter (02/08 1905) Resp:  [12-29] 18 (02/09 0617) BP: (136-182)/(78-155) 156/96 (02/09 0617) SpO2:  [95 %-100 %] 96 % (02/09 0617) Weight:  [116 lb 2.9 oz (52.7 kg)] 116 lb 2.9 oz (52.7 kg) (02/08 1214)  CBC:  Recent Labs  Lab 05/14/17 1118 05/14/17 1124  WBC 5.6  --   NEUTROABS 3.4  --   HGB 9.5* 10.2*   HCT 30.4* 30.0*  MCV 86.4  --   PLT 180  --     Basic Metabolic Panel:  Recent Labs  Lab 05/14/17 1118 05/14/17 1124  NA 136 136  K 4.3 4.3  CL 105 103  CO2 19*  --   GLUCOSE 128* 125*  BUN 38* 33*  CREATININE 1.80* 1.80*  CALCIUM 9.2  --     Lipid Panel:     Component Value Date/Time   CHOL 175 05/15/2017 0335   TRIG 90 05/15/2017 0335   HDL 45 05/15/2017 0335   CHOLHDL 3.9 05/15/2017 0335   VLDL 18 05/15/2017 0335   LDLCALC 112 (H) 05/15/2017 0335   HgbA1c:  Lab Results  Component Value Date   HGBA1C 6.4 (H) 05/15/2017   Urine Drug Screen: No results found for: LABOPIA, COCAINSCRNUR, LABBENZ, AMPHETMU, THCU, LABBARB  Alcohol Level No results found for: ETH  IMAGING   Ct Angio Head W Or Wo Contrast Ct Angio Neck W Or Wo Contrast 05/14/2017 IMPRESSION:  1. Positive for occlusion of the Left PCA, including occlusion of the distal Left Pcomm.  2. Positive also for moderate to severe irregularity and stenosis at the origin of the dominant posterior Left MCA M2 branch, but it is unclear whether this is related to chronic atherosclerosis or thromboembolic disease.  3. No other large vessel occlusion,  but there is moderate to severe stenosis of the Basilar Artery distal 3rd, and there is moderate stenosis of the right ICA siphon due to calcified plaque.  4. Atherosclerosis of the aortic arch, cervical carotid and vertebral arteries, but no significant stenosis in the neck.  5. Cardiomegaly.  Calcified coronary artery atherosclerosis.     Dg Chest Port 1 View 05/14/2017 IMPRESSION:  Limited study. Lungs appear clear. No evidence of pneumonia or pulmonary edema. Probable cardiomegaly.     Ct Head Code Stroke Wo Contrast 05/14/2017 IMPRESSION:  1. Subacute on chronic appearing infarct in the left PCA territory, including evidence of left thalamic involvement.  2. Superimposed age indeterminate ischemia in the left cerebellum.  3. No associated acute intracranial  hemorrhage or mass effect.  4. No other cytotoxic edema identified.  ASPECTS is 10.  5. Advanced nonspecific cerebral white matter disease.     Transthoracic Echocardiogram - pending 04/14/2017     PHYSICAL EXAM Vitals:   05/14/17 2145 05/14/17 2300 05/15/17 0148 05/15/17 0617  BP: (!) 136/94 140/89 (!) 142/78 (!) 156/96  Pulse: 84 88 81 71  Resp: 18 18 18 18   Temp: 98 F (36.7 C) 98.2 F (36.8 C) 98.4 F (36.9 C) 97.8 F (36.6 C)  TempSrc: Axillary Axillary Axillary Axillary  SpO2: 97% 97% 96% 96%  Weight:      Height:       Frail elderly Caucasian lady not in distress. . Afebrile. Head is nontraumatic. Neck is supple without bruit.    Cardiac exam no murmur or gallop. Lungs are clear to auscultation. Distal pulses are well felt.  Neurological Exam :  obtunded stuporose. Can be aroused to open eyes. Left gaze preference. Will not look to the right past midline. Will follow only occasional midline commands like sticking out her tongue and moving her left side purposefully. Pupils irregular but reactive. Blinks to threat on the left but not the right. Fundi not visualized. Right lower facial weakness. Tongue midline. Dense right hemiplegia trace withdrawal to pain. Purposeful antigravity movement in the left approximately. Withdraws in the left lower extremity to pain.       ASSESSMENT/PLAN [name] is a 84 y.o. female with history of hypertension, osteoarthritis, recent tibial fracture, and newly diagnosed atrial flutter presenting with aphasia and right-sided weakness. She did not receive IV t-PA due to late presentation.  Stroke:  Left PCA infarct likely embolic secondary to atrial flutter.  Resultant   global aphasia and right hemiplegia  CT head -  Subacute on chronic appearing infarct in the left PCA territory, including evidence of left thalamic involvement. Superimposed age indeterminate ischemia in the left cerebellum.   MRI head - pending  MRA  head - not performed  CTA H&N - Positive for occlusion of the Left PCA, including occlusion of the distal Left Pcomm. Moderate Right ICA stenosis.  Carotid Doppler - CTA neck  2D Echo - pending  LDL - 112  HgbA1c - 6.4  VTE prophylaxis - SCDs Diet NPO time specified Fall precautions Aspiration precautions  No antithrombotic prior to admission, now on aspirin 300 mg suppository daily  Ongoing aggressive stroke risk factor management  Therapy recommendations:  pending  Disposition:  Pending  Hypertension  Stable  Permissive hypertension (OK if < 220/120) but gradually normalize in 5-7 days  Long-term BP goal normotensive  Hyperlipidemia  Home meds: No lipid lowering medications prior to admission.  LDL 112, goal < 70  Add low-dose statin when PO  access is available.  Continue statin at discharge   Other Stroke Risk Factors  Advanced age  Hx stroke/TIA - by imaging  Newly diagnosed atrial flutter   Other Active Problems  Anemia  Renal disease   Plan / Recommendations   Stroke workup  Family will consider hospice placement   Hospital day # 1  I have personally examined this patient, reviewed notes, independently viewed imaging studies, participated in medical decision making and plan of care.ROS completed by me personally and pertinent positives fully documented  I have made any additions or clarifications directly to the above note.  She has presented with a large left MCA and PCA infarct likely of cardioembolic etiology from her new onset atrial flutter but given her poor baseline her prognosis is extremely poor and she is unlikely this will survive this without requiring feeding tube possible life support and prolonged nursing home stay. The patient's son is quite clear that she would not want that. He wants to give her a few days to decide if she gets any better. He agrees to DO NOT RESUSCITATE and not to escalate her care. Recheck speech therapy  swallow eval and if she is unable to swallow in the next few days he is agreeable to considering comfort care in the future. Long discussion with the patient's son as well as with Dr. Jomarie Longs and answered questions. Greater than 50% and in the study: Minute visit was spent on counseling and coordination of care about her large stroke, dysphagia and discussion of goals of care and answered questions Delia Heady, MD Medical Director [facility] Pager: [phone] 05/15/2017 12:53 PM   To contact Stroke Continuity provider, please refer to [email or web address]. After hours, contact General Neurology

## 2017-05-15 NOTE — Progress Notes (Signed)
PROGRESS NOTE    Sharon Ryan  GMW:[id number] DOB: Dec 27, 1918 DOA: 05/14/2017 PCP: Ralene Ok, MD  Brief Narrative:Sharon Ryan is an 84 y.o. female who is admitted for new onset right-sided weakness and new onset A. fib aphasia with altered mental status. She was living independently until October 2018 when she had a mechanical fall and sustained a tibial fracture. She was admitted here and was discharged to nursing home/rehabilitation facility. She is presently living in the nursing home and was noted to have some clumsiness of her right hand for a week and now aphasia. MRI pending  Assessment & Plan:   Aphasia/R sided weakness -suspect CVA -MRI pending -await SLP eval -overall prognosis appears quite poor, no family at bedside, will attempt to reach son today -continue ASA rectally  Non displaced right proximal tibia fracture -diagnosed in October following a fall -NWB RLE recommended then and Knee Immobilizer and FU with Dr.Swinteck recommended, unclear if this happended  Atrial FLutter -new diagnosis -ideally needs anticoagulation, but with advanced age and fall risk, need further discussion with family -at this time needs to be able to swallow safely as well, await swallow eval -may need Palliative eval based on clinical course/swallowing  Hypertension -Continue to hold antihypertensives in the setting of acute CVA to allow for permissible hypertension -PRN hydralazine ordered for systolic blood pressure of 220  Mild AKI on chronic kidney disease stage III  -Status post double hydration will stop IV fluids, await swallow evaluation  -Creatinine seems to have plateaued   CODE STATUS:DO NOT RESUSCITATE  DVT prophylaxis: Lovenox Code Status: DO NOT RESUSCITATE. Family Communication:  no family at bedside Disposition Plan: Nursing home/to be determined  Consultants:   Neurology   Procedures:   Antimicrobials:    Subjective: -Sleeping and  obtunded this morning, no events overnight -Per staff has aphasia  Objective: Vitals:   05/14/17 2300 05/15/17 0148 05/15/17 0617 05/15/17 0937  BP: 140/89 (!) 142/78 (!) 156/96 (!) 159/90  Pulse: 88 81 71 62  Resp: 18 18 18 16   Temp: 98.2 F (36.8 C) 98.4 F (36.9 C) 97.8 F (36.6 C) 97.7 F (36.5 C)  TempSrc: Axillary Axillary Axillary Axillary  SpO2: 97% 96% 96% 100%  Weight:      Height:        Intake/Output Summary (Last 24 hours) at 05/15/2017 1057 Last data filed at 05/15/2017 0217 Gross per 24 hour  Intake 758.33 ml  Output -  Net 758.33 ml   Filed Weights   05/14/17 1214  Weight: 52.7 kg (116 lb 2.9 oz)    Examination:  General exam: Somnolent, arouses minimally to verbal stimulation, facial droop noted HEENT :  facial droop noted Respiratory system: Decreased breath sounds to bases Cardiovascular system: S1 & S2 heard, RRR. No JVD, murmurs, rubs, gallops  Gastrointestinal system: Abdomen is nondistended, soft and nontender.Normal bowel sounds heard. Central nervous system: Somnolent, was not following commands, unable to assess Extremities: No edema, Skin: No rashes, lesions or ulcers Psychiatry: Unable to assess   Data Reviewed:   CBC: Recent Labs  Lab 05/14/17 1118 05/14/17 1124  WBC 5.6  --   NEUTROABS 3.4  --   HGB 9.5* 10.2*  HCT 30.4* 30.0*  MCV 86.4  --   PLT 180  --    Basic Metabolic Panel: Recent Labs  Lab 05/14/17 1118 05/14/17 1124  NA 136 136  K 4.3 4.3  CL 105 103  CO2 19*  --   GLUCOSE 128* 125*  BUN 38* 33*  CREATININE 1.80* 1.80*  CALCIUM 9.2  --    GFR: Estimated Creatinine Clearance: 14.5 mL/min (A) (by C-G formula based on SCr of 1.8 mg/dL (H)). Liver Function Tests: Recent Labs  Lab 05/14/17 1118  AST 24  ALT 14  ALKPHOS 85  BILITOT 0.8  PROT 6.6  ALBUMIN 3.4*   No results for input(s): LIPASE, AMYLASE in the last 168 hours. No results for input(s): AMMONIA in the last 168 hours. Coagulation  Profile: Recent Labs  Lab 05/14/17 1118  INR 1.14   Cardiac Enzymes: No results for input(s): CKTOTAL, CKMB, CKMBINDEX, TROPONINI in the last 168 hours. BNP (last 3 results) No results for input(s): PROBNP in the last 8760 hours. HbA1C: Recent Labs    05/15/17 0335  HGBA1C 6.4*   CBG: Recent Labs  Lab 05/14/17 1120  GLUCAP 116*   Lipid Profile: Recent Labs    05/15/17 0335  CHOL 175  HDL 45  LDLCALC 112*  TRIG 90  CHOLHDL 3.9   Thyroid Function Tests: No results for input(s): TSH, T4TOTAL, FREET4, T3FREE, THYROIDAB in the last 72 hours. Anemia Panel: No results for input(s): VITAMINB12, FOLATE, FERRITIN, TIBC, IRON, RETICCTPCT in the last 72 hours. Urine analysis: No results found for: COLORURINE, APPEARANCEUR, LABSPEC, PHURINE, GLUCOSEU, HGBUR, BILIRUBINUR, KETONESUR, PROTEINUR, UROBILINOGEN, NITRITE, LEUKOCYTESUR Sepsis Labs: @LABRCNTIP (procalcitonin:4,lacticidven:4)  ) Recent Results (from the past 240 hour(s))  MRSA PCR Screening     Status: None   Collection Time: 05/14/17  8:50 PM  Result Value Ref Range Status   MRSA by PCR NEGATIVE NEGATIVE Final    Comment:        The GeneXpert MRSA Assay (FDA approved for NASAL specimens only), is one component of a comprehensive MRSA colonization surveillance program. It is not intended to diagnose MRSA infection nor to guide or monitor treatment for MRSA infections. Performed at [facility], [address], [place], [place] [address]          Radiology Studies: Ct Angio Head W Or Wo Contrast  Result Date: 05/14/2017 CLINICAL DATA:  84 year old female code stroke. Right facial droop and right side weakness. Last seen normal 2000 hr yesterday. EXAM: CT ANGIOGRAPHY HEAD AND NECK TECHNIQUE: Multidetector CT imaging of the head and neck was performed using the standard protocol during bolus administration of intravenous contrast. Multiplanar CT image reconstructions and MIPs were obtained to evaluate  the vascular anatomy. Carotid stenosis measurements (when applicable) are obtained utilizing NASCET criteria, using the distal internal carotid diameter as the denominator. CONTRAST:  50 milliliters Isovue 370 COMPARISON:  Noncontrast head CT 1126 hr today. FINDINGS: CTA NECK Skeleton: Thoracic kyphoscoliosis. Cervical spine degeneration. Absent dentition. No acute osseous abnormality identified. Upper chest: Cardiomegaly. Calcified coronary artery atherosclerosis. Negative visible lung parenchyma. No superior mediastinal lymphadenopathy. Other neck: Negative. Small thyroid nodules which do not meet consensus criteria for thyroid ultrasound follow-up. No cervical lymphadenopathy. Aortic arch: Moderate soft and calcified plaque throughout the aortic arch and visible descending thoracic aorta. Calcified coronary artery atherosclerosis. 3 vessel arch configuration. Right carotid system: No brachiocephalic or right CCA origin stenosis despite mild plaque. Tortuous proximal right CCA. Soft and calcified plaque at the right carotid bifurcation and right ICA origin with less than 50 % stenosis with respect to the distal vessel of the proximal right ICA. Mildly tortuous mid cervical right ICA. Left carotid system: No left CCA origin stenosis. Tortuous proximal left CCA. Calcified plaque at the left carotid bifurcation with more bulky lateral left  ICA origin and bulb mixed soft and calcified plaque. Still, less than 50 % stenosis with respect to the distal vessel. Tortuous mid cervical left ICA. Vertebral arteries: . Patent no proximal right subclavian right vertebral artery origin artery stenosis without stenosis. Tortuous right V1 segment. Patent right vertebral artery to the skull base without focal stenosis. No proximal left subclavian artery stenosis despite plaque. Normal left vertebral artery origin. Tortuous left V1 segment. Irregularity of the left vertebral artery V2 segment but the vessel remains patent to the  skull base without focal stenosis. CTA HEAD Posterior circulation: Patent distal vertebral arteries, the left is diminutive beyond the left PICA origin. The right PICA origin is patent. No distal vertebral artery stenosis. Patent vertebrobasilar junction. Diminutive basilar artery. There is moderate to severe irregularity in the distal 3rd of the basilar with short-segment moderate to severe stenosis of the vessel (series 9, image 106 and series 13, image 24. still, the distal basilar is patent. SCA origins are patent. There are fetal type PCA origins. The left posterior communicating artery is occluded just before the junction with the left P1 segment, which is also occluded. The left P2 and P3 are largely occluded with minimal reconstituted enhancement (series 12, image 20). There is mild to moderate widespread irregularity in the right PCA and its branches which remain patent. Anterior circulation: Both ICA siphons are patent. There is moderate left and severe right ICA siphon calcified plaque. No left siphon stenosis. Left ophthalmic and posterior communicating artery origins are patent. On the right side there is mild to moderate distal cavernous and proximal supraclinoid ICA stenosis due to calcified plaque. Normal right ophthalmic and posterior communicating artery origins. Patent carotid termini. Patent MCA and ACA origins with only mild irregularity (series 13, image 20). The anterior communicating artery and bilateral ACA branches are within normal limits. Left MCA M1 segment and left MCA bifurcation are patent. The more anterior left M2 branch appears normal. There is moderate to severe irregularity and stenosis affecting the proximal few millimeters of the dominant posterior left M2 branch best seen on series 13, image 21. No left MCA branch occlusion is identified. Right MCA M1 segment is tortuous and patent. Right MCA bifurcation is patent. Right MCA branches are mildly to moderately irregular. No  right MCA branch occlusion identified. Venous sinuses: Not evaluated due to early contrast timing. Anatomic variants: Fetal type bilateral PCA origins. Review of the MIP images confirms the above findings IMPRESSION: 1. Positive for occlusion of the Left PCA, including occlusion of the distal Left Pcomm. 2. Positive also for moderate to severe irregularity and stenosis at the origin of the dominant posterior Left MCA M2 branch, but it is unclear whether this is related to chronic atherosclerosis or thromboembolic disease. 3. No other large vessel occlusion, but there is moderate to severe stenosis of the Basilar Artery distal 3rd, and there is moderate stenosis of the right ICA siphon due to calcified plaque. 4. Atherosclerosis of the aortic arch, cervical carotid and vertebral arteries, but no significant stenosis in the neck. 5. Cardiomegaly.  Calcified coronary artery atherosclerosis. Salient findings were communicated to Dr. Amada Jupiter at 12:20 pmon 2/8/2019by text page via the [facility] messaging system. Electronically Signed   By: Odessa Fleming M.D.   On: 05/14/2017 12:21   Ct Angio Neck W Or Wo Contrast  Result Date: 05/14/2017 CLINICAL DATA:  84 year old female code stroke. Right facial droop and right side weakness. Last seen normal 2000 hr yesterday. EXAM: CT ANGIOGRAPHY HEAD AND  NECK TECHNIQUE: Multidetector CT imaging of the head and neck was performed using the standard protocol during bolus administration of intravenous contrast. Multiplanar CT image reconstructions and MIPs were obtained to evaluate the vascular anatomy. Carotid stenosis measurements (when applicable) are obtained utilizing NASCET criteria, using the distal internal carotid diameter as the denominator. CONTRAST:  50 milliliters Isovue 370 COMPARISON:  Noncontrast head CT 1126 hr today. FINDINGS: CTA NECK Skeleton: Thoracic kyphoscoliosis. Cervical spine degeneration. Absent dentition. No acute osseous abnormality identified. Upper chest:  Cardiomegaly. Calcified coronary artery atherosclerosis. Negative visible lung parenchyma. No superior mediastinal lymphadenopathy. Other neck: Negative. Small thyroid nodules which do not meet consensus criteria for thyroid ultrasound follow-up. No cervical lymphadenopathy. Aortic arch: Moderate soft and calcified plaque throughout the aortic arch and visible descending thoracic aorta. Calcified coronary artery atherosclerosis. 3 vessel arch configuration. Right carotid system: No brachiocephalic or right CCA origin stenosis despite mild plaque. Tortuous proximal right CCA. Soft and calcified plaque at the right carotid bifurcation and right ICA origin with less than 50 % stenosis with respect to the distal vessel of the proximal right ICA. Mildly tortuous mid cervical right ICA. Left carotid system: No left CCA origin stenosis. Tortuous proximal left CCA. Calcified plaque at the left carotid bifurcation with more bulky lateral left ICA origin and bulb mixed soft and calcified plaque. Still, less than 50 % stenosis with respect to the distal vessel. Tortuous mid cervical left ICA. Vertebral arteries: . Patent no proximal right subclavian right vertebral artery origin artery stenosis without stenosis. Tortuous right V1 segment. Patent right vertebral artery to the skull base without focal stenosis. No proximal left subclavian artery stenosis despite plaque. Normal left vertebral artery origin. Tortuous left V1 segment. Irregularity of the left vertebral artery V2 segment but the vessel remains patent to the skull base without focal stenosis. CTA HEAD Posterior circulation: Patent distal vertebral arteries, the left is diminutive beyond the left PICA origin. The right PICA origin is patent. No distal vertebral artery stenosis. Patent vertebrobasilar junction. Diminutive basilar artery. There is moderate to severe irregularity in the distal 3rd of the basilar with short-segment moderate to severe stenosis of the  vessel (series 9, image 106 and series 13, image 24. still, the distal basilar is patent. SCA origins are patent. There are fetal type PCA origins. The left posterior communicating artery is occluded just before the junction with the left P1 segment, which is also occluded. The left P2 and P3 are largely occluded with minimal reconstituted enhancement (series 12, image 20). There is mild to moderate widespread irregularity in the right PCA and its branches which remain patent. Anterior circulation: Both ICA siphons are patent. There is moderate left and severe right ICA siphon calcified plaque. No left siphon stenosis. Left ophthalmic and posterior communicating artery origins are patent. On the right side there is mild to moderate distal cavernous and proximal supraclinoid ICA stenosis due to calcified plaque. Normal right ophthalmic and posterior communicating artery origins. Patent carotid termini. Patent MCA and ACA origins with only mild irregularity (series 13, image 20). The anterior communicating artery and bilateral ACA branches are within normal limits. Left MCA M1 segment and left MCA bifurcation are patent. The more anterior left M2 branch appears normal. There is moderate to severe irregularity and stenosis affecting the proximal few millimeters of the dominant posterior left M2 branch best seen on series 13, image 21. No left MCA branch occlusion is identified. Right MCA M1 segment is tortuous and patent. Right MCA bifurcation is  patent. Right MCA branches are mildly to moderately irregular. No right MCA branch occlusion identified. Venous sinuses: Not evaluated due to early contrast timing. Anatomic variants: Fetal type bilateral PCA origins. Review of the MIP images confirms the above findings IMPRESSION: 1. Positive for occlusion of the Left PCA, including occlusion of the distal Left Pcomm. 2. Positive also for moderate to severe irregularity and stenosis at the origin of the dominant posterior  Left MCA M2 branch, but it is unclear whether this is related to chronic atherosclerosis or thromboembolic disease. 3. No other large vessel occlusion, but there is moderate to severe stenosis of the Basilar Artery distal 3rd, and there is moderate stenosis of the right ICA siphon due to calcified plaque. 4. Atherosclerosis of the aortic arch, cervical carotid and vertebral arteries, but no significant stenosis in the neck. 5. Cardiomegaly.  Calcified coronary artery atherosclerosis. Salient findings were communicated to Dr. Amada Jupiter at 12:20 pmon 2/8/2019by text page via the [facility] messaging system. Electronically Signed   By: Odessa Fleming M.D.   On: 05/14/2017 12:21   Dg Chest Port 1 View  Result Date: 05/14/2017 CLINICAL DATA:  Stroke affecting right side. Hx of hypertension. EXAM: PORTABLE CHEST 1 VIEW COMPARISON:  None. FINDINGS: Patient unable to be properly positioned limiting characterization. Grossly, lungs appear clear. Probable cardiomegaly. No pleural effusion or pneumothorax seen. IMPRESSION: Limited study. Lungs appear clear. No evidence of pneumonia or pulmonary edema. Probable cardiomegaly. Electronically Signed   By: Bary Richard M.D.   On: 05/14/2017 13:53   Ct Head Code Stroke Wo Contrast  Result Date: 05/14/2017 CLINICAL DATA:  Code stroke. 84 year old female with right facial droop and right side weakness. Last seen normal 2000 hr yesterday. EXAM: CT HEAD WITHOUT CONTRAST TECHNIQUE: Contiguous axial images were obtained from the base of the skull through the vertex without intravenous contrast. COMPARISON:  None. FINDINGS: Study is intermittently degraded by motion artifact. Brain: Patchy hypodensity compatible with cytotoxic edema in the left PCA territory (series 3, image 14), but superimposed on chronic appearing inferior left occipital and posterior temporal lobe encephalomalacia. There is asymmetric patchy hypodensity in the left thalamus. Superimposed confluent bilateral widespread  cerebral white matter hypodensity. Small foci of age indeterminate hypodensity in the right caudate. The other deep gray matter nuclei, and the brainstem gray-white matter differentiation is within normal limits. Age indeterminate patchy hypodensity in the central left cerebellum. The right cerebellum appears within normal limits. No other cytotoxic edema identified. No acute intracranial hemorrhage identified. No midline shift, mass effect, or evidence of intracranial mass lesion. No ventriculomegaly. Vascular: Calcified atherosclerosis at the skull base. No suspicious intracranial vascular hyperdensity. Skull: No acute osseous abnormality identified. Sinuses/Orbits: Mild ethmoid sinus mucosal thickening, otherwise clear. Other: No acute orbit or scalp soft tissue finding identified. ASPECTS [facility] Stroke Program Early CT Score) Total score (0-10 with 10 being normal): 10, although the left PCA and left cerebellar artery territories are abnormal. IMPRESSION: 1. Subacute on chronic appearing infarct in the left PCA territory, including evidence of left thalamic involvement. 2. Superimposed age indeterminate ischemia in the left cerebellum. 3. No associated acute intracranial hemorrhage or mass effect. 4. No other cytotoxic edema identified.  ASPECTS is 10. 5. Advanced nonspecific cerebral white matter disease. 6. These results were communicated to Dr. Amada Jupiter at 11:40 amon 2/8/2019by text page via the [facility] messaging system. Electronically Signed   By: Odessa Fleming M.D.   On: 05/14/2017 11:40        Scheduled Meds: .  stroke:  mapping our early stages of recovery book   Does not apply Once  . aspirin  300 mg Rectal Daily   Continuous Infusions:   LOS: 1 day    Time spent: 35min    Zannie CovePreetha Harshil Cavallaro, MD [facility] Page via [email or web address], password [email or web address] After 7PM please contact night-coverage  05/15/2017, 10:57 AM

## 2017-05-15 NOTE — Progress Notes (Signed)
VASCULAR LAB   Patient had normal CTA of neck. Please advise if carotid duplex is still needed.  Thanks,      Erla Bacchi, RVT 05/15/2017, 7:18 AM

## 2017-05-15 NOTE — Evaluation (Signed)
Clinical/Bedside Swallow Evaluation Patient Details  Name: Sharon Ryan MRN: [id number] Date of Birth: [birth year]  Today's Date: 05/15/2017 Time: SLP Start Time (ACUTE ONLY): 1305 SLP Stop Time (ACUTE ONLY): 1320 SLP Time Calculation (min) (ACUTE ONLY): 15 min  Past Medical History:  Past Medical History:  Diagnosis Date  . Hypertension   . Osteoarthritis    Past Surgical History: History reviewed. No pertinent surgical history. HPI:  Pt is a 84 y.o. female who is admitted from [facility] nursing facility with new onset right-sided weakness, new onset A. fib and aphasia with altered mental status. Neuro was consulted and determined a subacute on chronic appearing left PCA infarct likely cause of her presentation. PMH includes mechanical fall with tibial fx in Oct 2018.   Assessment / Plan / Recommendation Clinical Impression  Patient presents with a severe oral and mod-severe pharyngeal dysphagia with significant impact from cognitive functioning and current level of alertness. Patient did accept several toothette sponges soaked with water and did initiate swallow, although with delays in initiation, but no overt s/s of aspiration or penetration. Patient accepted 2 1/4 teaspoon boluses of puree applesauce and did initaite swallow, with delay in oral transit and swallow initiation. Patient then started to refuse any further boluses. Oral cavity was clear post swallows. Patient with significant weakness or oral-motor musculature as well as inability to maintain adequate alertness for PO's. Per MD, plan for patient is to return to SNF, and he(Dr. Pearlean Brownie) requested that a decision be made by tomorrow (Sunday, 2/10) regarding patient going on comfort measures for PO's versus starting on a diet.  SLP Visit Diagnosis: Dysphagia, oropharyngeal phase (R13.12)    Aspiration Risk  Severe aspiration risk;Risk for inadequate nutrition/hydration    Diet Recommendation NPO   Medication Administration:  Via alternative means    Other  Recommendations Oral Care Recommendations: Oral care QID   Follow up Recommendations Skilled Nursing facility      Frequency and Duration min 2x/week  1 week       Prognosis Prognosis for Safe Diet Advancement: Guarded Barriers to Reach Goals: Cognitive deficits;Severity of deficits      Swallow Study   General Date of Onset: 05/14/17 HPI: Pt is a 84 y.o. female who is admitted from [facility] nursing facility with new onset right-sided weakness, new onset A. fib and aphasia with altered mental status. Neuro was consulted and determined a subacute on chronic appearing left PCA infarct likely cause of her presentation. PMH includes mechanical fall with tibial fx in Oct 2018. Type of Study: Bedside Swallow Evaluation Previous Swallow Assessment: N/A Diet Prior to this Study: NPO Temperature Spikes Noted: No Respiratory Status: Room air History of Recent Intubation: No Behavior/Cognition: Lethargic/Drowsy;Requires cueing;Doesn't follow directions Oral Care Completed by SLP: Yes Oral Cavity - Dentition: Edentulous;Other (Comment)(difficult to complete oral assessment due to patient not opening mouth very much at all) Self-Feeding Abilities: Total assist Patient Positioning: Partially reclined Baseline Vocal Quality: Not observed Volitional Cough: Cognitively unable to elicit Volitional Swallow: Unable to elicit    Oral/Motor/Sensory Function Overall Oral Motor/Sensory Function: Severe impairment Facial ROM: Reduced right Facial Symmetry: Abnormal symmetry right Facial Strength: Reduced right Facial Sensation: Reduced right Lingual ROM: Other (Comment)(unable to adequately assess secondary to patient compliance) Lingual Strength: Reduced   Ice Chips Ice chips: Not tested   Thin Liquid Thin Liquid: Impaired Presentation: (toothette sponge) Oral Phase Impairments: Reduced labial seal;Poor awareness of bolus Oral Phase Functional Implications:  Prolonged oral transit Pharyngeal  Phase Impairments: Suspected delayed  Swallow Other Comments: Patient was able to initiate a few swallows during trials of toothette sponges soaked with water    Nectar Thick Nectar Thick Liquid: Not tested   Honey Thick Honey Thick Liquid: Not tested   Puree Puree: Impaired Oral Phase Impairments: Reduced labial seal;Reduced lingual movement/coordination;Poor awareness of bolus Oral Phase Functional Implications: Prolonged oral transit Pharyngeal Phase Impairments: Suspected delayed Swallow Other Comments: Patient initiated swallow of two, 1/4 teaspoon size bites of puree applesacue.   Solid   GO   Solid: Not tested        Angela NevinJohn T. Young Mulvey, MA, CCC-SLP 05/15/17 4:14 PM

## 2017-05-16 NOTE — Progress Notes (Signed)
Called and Updated son this morning, pt remains minimally responsive, MRI with multiple Strokes Too obtunded for swallow eval -Called and updated son, recommended [facility], son is agreeable -Social work consult for [facility] -Full Note to follow  Zannie CovePreetha Tyia Binford, MD

## 2017-05-16 NOTE — Plan of Care (Signed)
  Education: Knowledge of General Education information will improve 05/16/2017 0211 - Not Progressing by Olena Materobinson, Shawntay Prest G, RN Note Pt. very lethargic; reviewed POC  and unsure if pt. understood anything.

## 2017-05-16 NOTE — Progress Notes (Signed)
PROGRESS NOTE    Sharon Ryan  OZH:[id number] DOB: 1919-02-17 DOA: 05/14/2017 PCP: Ralene Ok, MD  Brief Narrative:Sharon Ryan is an 84 y.o. female who is admitted for new onset right-sided weakness and new onset A. fib aphasia with altered mental status. She was living independently until October 2018 when she had a mechanical fall and sustained a tibial fracture. She was admitted here and was discharged to nursing home/rehabilitation facility. She is presently living in the nursing home and was noted to have some clumsiness of her right hand for a week and now aphasia. MRI pending  Assessment & Plan:   Multiple acute strokes  - MRI with acute CVA involving medial and inferior left temporal lobe, the posterior hippocampus, the left occipital lobe, and the left thalamus. Remote lacunar infarcts of the right basal ganglia and cerebellum, left greater than right and Advanced atrophy -Remains obtunded/minimally responsive, -Unable to have swallow eval  -Called and updated son, recommended comfort focused care and residential hospice -Son is agreeable to this, Child psychotherapist consult for residential hospice  Non displaced right proximal tibia fracture -diagnosed in October following a fall -NWB RLE recommended then and Knee Immobilizer -Now comfort care   Atrial FLutter -Now comfort care   Hypertension -Comfort Care  Mild AKI on chronic kidney disease stage III  -Stop fluids, comfort measures only  CODE STATUS:DO NOT RESUSCITATE  DVT prophylaxis: Lovenox Code Status: DO NOT RESUSCITATE. Family Communication:  no family at bedside, called and updated son  Disposition Plan:  residential hospice if possible   Consultants:   Neurology   Procedures:   Antimicrobials:    Subjective: -Remains obtunded, had periods of apnea overnight, now on a Ventimask Objective: Vitals:   05/16/17 0300 05/16/17 0302 05/16/17 0618 05/16/17 1030  BP:  (!) 173/85 (!) 180/94  140/82  Pulse: 72  86 95  Resp:  14 14 20   Temp: 97.8 F (36.6 C)  97.9 F (36.6 C) 97.7 F (36.5 C)  TempSrc: Axillary  Axillary Axillary  SpO2:  96% 95% 97%  Weight:      Height:       No intake or output data in the 24 hours ending 05/16/17 1119 Filed Weights   05/14/17 1214  Weight: 52.7 kg (116 lb 2.9 oz)    Examination:  Gen:  Obtunded, minimally responsive, moaning HEENT: Facial droop noted Lungs:  Scattered rhonchi and decreased breath sounds at bases CVS: RRR, Abd: soft, Non tender, non distended, BS present Extremities: No edema  Skin: no new rashes Neuro: Obtunded, does not follow commands, withdraws to painful stimuli Psychiatry: Unable to assess   Data Reviewed:   CBC: Recent Labs  Lab 05/14/17 1118 05/14/17 1124  WBC 5.6  --   NEUTROABS 3.4  --   HGB 9.5* 10.2*  HCT 30.4* 30.0*  MCV 86.4  --   PLT 180  --    Basic Metabolic Panel: Recent Labs  Lab 05/14/17 1118 05/14/17 1124  NA 136 136  K 4.3 4.3  CL 105 103  CO2 19*  --   GLUCOSE 128* 125*  BUN 38* 33*  CREATININE 1.80* 1.80*  CALCIUM 9.2  --    GFR: Estimated Creatinine Clearance: 14.5 mL/min (A) (by C-G formula based on SCr of 1.8 mg/dL (H)). Liver Function Tests: Recent Labs  Lab 05/14/17 1118  AST 24  ALT 14  ALKPHOS 85  BILITOT 0.8  PROT 6.6  ALBUMIN 3.4*   No results for input(s): LIPASE, AMYLASE  in the last 168 hours. No results for input(s): AMMONIA in the last 168 hours. Coagulation Profile: Recent Labs  Lab 05/14/17 1118  INR 1.14   Cardiac Enzymes: No results for input(s): CKTOTAL, CKMB, CKMBINDEX, TROPONINI in the last 168 hours. BNP (last 3 results) No results for input(s): PROBNP in the last 8760 hours. HbA1C: Recent Labs    05/15/17 0335  HGBA1C 6.4*   CBG: Recent Labs  Lab 05/14/17 1120  GLUCAP 116*   Lipid Profile: Recent Labs    05/15/17 0335  CHOL 175  HDL 45  LDLCALC 112*  TRIG 90  CHOLHDL 3.9   Thyroid Function Tests: No  results for input(s): TSH, T4TOTAL, FREET4, T3FREE, THYROIDAB in the last 72 hours. Anemia Panel: No results for input(s): VITAMINB12, FOLATE, FERRITIN, TIBC, IRON, RETICCTPCT in the last 72 hours. Urine analysis: No results found for: COLORURINE, APPEARANCEUR, LABSPEC, PHURINE, GLUCOSEU, HGBUR, BILIRUBINUR, KETONESUR, PROTEINUR, UROBILINOGEN, NITRITE, LEUKOCYTESUR Sepsis Labs: @LABRCNTIP (procalcitonin:4,lacticidven:4)  ) Recent Results (from the past 240 hour(s))  MRSA PCR Screening     Status: None   Collection Time: 05/14/17  8:50 PM  Result Value Ref Range Status   MRSA by PCR NEGATIVE NEGATIVE Final    Comment:        The GeneXpert MRSA Assay (FDA approved for NASAL specimens only), is one component of a comprehensive MRSA colonization surveillance program. It is not intended to diagnose MRSA infection nor to guide or monitor treatment for MRSA infections. Performed at [facility], [address] [place], [place] [address]          Radiology Studies: Ct Angio Head W Or Wo Contrast  Result Date: 05/14/2017 CLINICAL DATA:  84 year old female code stroke. Right facial droop and right side weakness. Last seen normal 2000 hr yesterday. EXAM: CT ANGIOGRAPHY HEAD AND NECK TECHNIQUE: Multidetector CT imaging of the head and neck was performed using the standard protocol during bolus administration of intravenous contrast. Multiplanar CT image reconstructions and MIPs were obtained to evaluate the vascular anatomy. Carotid stenosis measurements (when applicable) are obtained utilizing NASCET criteria, using the distal internal carotid diameter as the denominator. CONTRAST:  50 milliliters Isovue 370 COMPARISON:  Noncontrast head CT 1126 hr today. FINDINGS: CTA NECK Skeleton: Thoracic kyphoscoliosis. Cervical spine degeneration. Absent dentition. No acute osseous abnormality identified. Upper chest: Cardiomegaly. Calcified coronary artery atherosclerosis. Negative visible lung  parenchyma. No superior mediastinal lymphadenopathy. Other neck: Negative. Small thyroid nodules which do not meet consensus criteria for thyroid ultrasound follow-up. No cervical lymphadenopathy. Aortic arch: Moderate soft and calcified plaque throughout the aortic arch and visible descending thoracic aorta. Calcified coronary artery atherosclerosis. 3 vessel arch configuration. Right carotid system: No brachiocephalic or right CCA origin stenosis despite mild plaque. Tortuous proximal right CCA. Soft and calcified plaque at the right carotid bifurcation and right ICA origin with less than 50 % stenosis with respect to the distal vessel of the proximal right ICA. Mildly tortuous mid cervical right ICA. Left carotid system: No left CCA origin stenosis. Tortuous proximal left CCA. Calcified plaque at the left carotid bifurcation with more bulky lateral left ICA origin and bulb mixed soft and calcified plaque. Still, less than 50 % stenosis with respect to the distal vessel. Tortuous mid cervical left ICA. Vertebral arteries: . Patent no proximal right subclavian right vertebral artery origin artery stenosis without stenosis. Tortuous right V1 segment. Patent right vertebral artery to the skull base without focal stenosis. No proximal left subclavian artery stenosis despite plaque. Normal left  vertebral artery origin. Tortuous left V1 segment. Irregularity of the left vertebral artery V2 segment but the vessel remains patent to the skull base without focal stenosis. CTA HEAD Posterior circulation: Patent distal vertebral arteries, the left is diminutive beyond the left PICA origin. The right PICA origin is patent. No distal vertebral artery stenosis. Patent vertebrobasilar junction. Diminutive basilar artery. There is moderate to severe irregularity in the distal 3rd of the basilar with short-segment moderate to severe stenosis of the vessel (series 9, image 106 and series 13, image 24. still, the distal basilar is  patent. SCA origins are patent. There are fetal type PCA origins. The left posterior communicating artery is occluded just before the junction with the left P1 segment, which is also occluded. The left P2 and P3 are largely occluded with minimal reconstituted enhancement (series 12, image 20). There is mild to moderate widespread irregularity in the right PCA and its branches which remain patent. Anterior circulation: Both ICA siphons are patent. There is moderate left and severe right ICA siphon calcified plaque. No left siphon stenosis. Left ophthalmic and posterior communicating artery origins are patent. On the right side there is mild to moderate distal cavernous and proximal supraclinoid ICA stenosis due to calcified plaque. Normal right ophthalmic and posterior communicating artery origins. Patent carotid termini. Patent MCA and ACA origins with only mild irregularity (series 13, image 20). The anterior communicating artery and bilateral ACA branches are within normal limits. Left MCA M1 segment and left MCA bifurcation are patent. The more anterior left M2 branch appears normal. There is moderate to severe irregularity and stenosis affecting the proximal few millimeters of the dominant posterior left M2 branch best seen on series 13, image 21. No left MCA branch occlusion is identified. Right MCA M1 segment is tortuous and patent. Right MCA bifurcation is patent. Right MCA branches are mildly to moderately irregular. No right MCA branch occlusion identified. Venous sinuses: Not evaluated due to early contrast timing. Anatomic variants: Fetal type bilateral PCA origins. Review of the MIP images confirms the above findings IMPRESSION: 1. Positive for occlusion of the Left PCA, including occlusion of the distal Left Pcomm. 2. Positive also for moderate to severe irregularity and stenosis at the origin of the dominant posterior Left MCA M2 branch, but it is unclear whether this is related to chronic  atherosclerosis or thromboembolic disease. 3. No other large vessel occlusion, but there is moderate to severe stenosis of the Basilar Artery distal 3rd, and there is moderate stenosis of the right ICA siphon due to calcified plaque. 4. Atherosclerosis of the aortic arch, cervical carotid and vertebral arteries, but no significant stenosis in the neck. 5. Cardiomegaly.  Calcified coronary artery atherosclerosis. Salient findings were communicated to Dr. Amada Jupiter at 12:20 pmon 2/8/2019by text page via the [facility] messaging system. Electronically Signed   By: Odessa Fleming M.D.   On: 05/14/2017 12:21   Ct Angio Neck W Or Wo Contrast  Result Date: 05/14/2017 CLINICAL DATA:  84 year old female code stroke. Right facial droop and right side weakness. Last seen normal 2000 hr yesterday. EXAM: CT ANGIOGRAPHY HEAD AND NECK TECHNIQUE: Multidetector CT imaging of the head and neck was performed using the standard protocol during bolus administration of intravenous contrast. Multiplanar CT image reconstructions and MIPs were obtained to evaluate the vascular anatomy. Carotid stenosis measurements (when applicable) are obtained utilizing NASCET criteria, using the distal internal carotid diameter as the denominator. CONTRAST:  50 milliliters Isovue 370 COMPARISON:  Noncontrast head CT 1126 hr  today. FINDINGS: CTA NECK Skeleton: Thoracic kyphoscoliosis. Cervical spine degeneration. Absent dentition. No acute osseous abnormality identified. Upper chest: Cardiomegaly. Calcified coronary artery atherosclerosis. Negative visible lung parenchyma. No superior mediastinal lymphadenopathy. Other neck: Negative. Small thyroid nodules which do not meet consensus criteria for thyroid ultrasound follow-up. No cervical lymphadenopathy. Aortic arch: Moderate soft and calcified plaque throughout the aortic arch and visible descending thoracic aorta. Calcified coronary artery atherosclerosis. 3 vessel arch configuration. Right carotid system: No  brachiocephalic or right CCA origin stenosis despite mild plaque. Tortuous proximal right CCA. Soft and calcified plaque at the right carotid bifurcation and right ICA origin with less than 50 % stenosis with respect to the distal vessel of the proximal right ICA. Mildly tortuous mid cervical right ICA. Left carotid system: No left CCA origin stenosis. Tortuous proximal left CCA. Calcified plaque at the left carotid bifurcation with more bulky lateral left ICA origin and bulb mixed soft and calcified plaque. Still, less than 50 % stenosis with respect to the distal vessel. Tortuous mid cervical left ICA. Vertebral arteries: . Patent no proximal right subclavian right vertebral artery origin artery stenosis without stenosis. Tortuous right V1 segment. Patent right vertebral artery to the skull base without focal stenosis. No proximal left subclavian artery stenosis despite plaque. Normal left vertebral artery origin. Tortuous left V1 segment. Irregularity of the left vertebral artery V2 segment but the vessel remains patent to the skull base without focal stenosis. CTA HEAD Posterior circulation: Patent distal vertebral arteries, the left is diminutive beyond the left PICA origin. The right PICA origin is patent. No distal vertebral artery stenosis. Patent vertebrobasilar junction. Diminutive basilar artery. There is moderate to severe irregularity in the distal 3rd of the basilar with short-segment moderate to severe stenosis of the vessel (series 9, image 106 and series 13, image 24. still, the distal basilar is patent. SCA origins are patent. There are fetal type PCA origins. The left posterior communicating artery is occluded just before the junction with the left P1 segment, which is also occluded. The left P2 and P3 are largely occluded with minimal reconstituted enhancement (series 12, image 20). There is mild to moderate widespread irregularity in the right PCA and its branches which remain patent. Anterior  circulation: Both ICA siphons are patent. There is moderate left and severe right ICA siphon calcified plaque. No left siphon stenosis. Left ophthalmic and posterior communicating artery origins are patent. On the right side there is mild to moderate distal cavernous and proximal supraclinoid ICA stenosis due to calcified plaque. Normal right ophthalmic and posterior communicating artery origins. Patent carotid termini. Patent MCA and ACA origins with only mild irregularity (series 13, image 20). The anterior communicating artery and bilateral ACA branches are within normal limits. Left MCA M1 segment and left MCA bifurcation are patent. The more anterior left M2 branch appears normal. There is moderate to severe irregularity and stenosis affecting the proximal few millimeters of the dominant posterior left M2 branch best seen on series 13, image 21. No left MCA branch occlusion is identified. Right MCA M1 segment is tortuous and patent. Right MCA bifurcation is patent. Right MCA branches are mildly to moderately irregular. No right MCA branch occlusion identified. Venous sinuses: Not evaluated due to early contrast timing. Anatomic variants: Fetal type bilateral PCA origins. Review of the MIP images confirms the above findings IMPRESSION: 1. Positive for occlusion of the Left PCA, including occlusion of the distal Left Pcomm. 2. Positive also for moderate to severe irregularity and stenosis at  the origin of the dominant posterior Left MCA M2 branch, but it is unclear whether this is related to chronic atherosclerosis or thromboembolic disease. 3. No other large vessel occlusion, but there is moderate to severe stenosis of the Basilar Artery distal 3rd, and there is moderate stenosis of the right ICA siphon due to calcified plaque. 4. Atherosclerosis of the aortic arch, cervical carotid and vertebral arteries, but no significant stenosis in the neck. 5. Cardiomegaly.  Calcified coronary artery atherosclerosis.  Salient findings were communicated to Dr. Amada Jupiter at 12:20 pmon 2/8/2019by text page via the [facility] messaging system. Electronically Signed   By: Odessa Fleming M.D.   On: 05/14/2017 12:21   Mr Brain Wo Contrast  Result Date: 05/15/2017 CLINICAL DATA:  Ataxia.  Altered level of consciousness. EXAM: MRI HEAD WITHOUT CONTRAST TECHNIQUE: Multiplanar, multiecho pulse sequences of the brain and surrounding structures were obtained without intravenous contrast. COMPARISON:  CTA of the head and neck 05/14/2016. FINDINGS: Brain: Diffusion-weighted images demonstrate acute/subacute nonhemorrhagic infarcts involving the inferomedial posterior left temporal lobe, posterior hippocampus, the left occipital pole, and the left thalamus. No significant right-sided infarct or anterior circulation infarct is present. No acute cerebellar infarct is present. Advanced atrophy and diffuse white matter disease is present bilaterally. Remote lacunar infarcts are present in the anterior right basal ganglia. Remote lacunar infarcts are present in the cerebellum, left greater than right. Vascular: Abnormal signal in the left posterior cerebral artery is compatible with known occlusion. Flow is present in the basilar artery and both vertebral arteries. Flow is present in the anterior circulation. Skull and upper cervical spine: The skull base is within normal limits. The craniocervical junction is normal. Exaggerated cervical lordosis is present. Marrow signal is normal. Midline sagittal structures are otherwise unremarkable. Sinuses/Orbits: The paranasal sinuses and mastoid air cells are clear. Bilateral lens replacements are present. Globes and orbits are otherwise within normal limits. IMPRESSION: 1. Acute/subacute left PCA territory infarct involving the medial and inferior left temporal lobe, the posterior hippocampus, the left occipital lobe, and the left thalamus. 2. Remote lacunar infarcts of the right basal ganglia and cerebellum, left  greater than right. 3. Atrophy and diffuse white matter disease is advanced, even for age. This likely reflects the sequela of chronic microvascular ischemia. Electronically Signed   By: Marin Roberts M.D.   On: 05/15/2017 19:28   Dg Chest Port 1 View  Result Date: 05/14/2017 CLINICAL DATA:  Stroke affecting right side. Hx of hypertension. EXAM: PORTABLE CHEST 1 VIEW COMPARISON:  None. FINDINGS: Patient unable to be properly positioned limiting characterization. Grossly, lungs appear clear. Probable cardiomegaly. No pleural effusion or pneumothorax seen. IMPRESSION: Limited study. Lungs appear clear. No evidence of pneumonia or pulmonary edema. Probable cardiomegaly. Electronically Signed   By: Bary Richard M.D.   On: 05/14/2017 13:53   Ct Head Code Stroke Wo Contrast  Result Date: 05/14/2017 CLINICAL DATA:  Code stroke. 84 year old female with right facial droop and right side weakness. Last seen normal 2000 hr yesterday. EXAM: CT HEAD WITHOUT CONTRAST TECHNIQUE: Contiguous axial images were obtained from the base of the skull through the vertex without intravenous contrast. COMPARISON:  None. FINDINGS: Study is intermittently degraded by motion artifact. Brain: Patchy hypodensity compatible with cytotoxic edema in the left PCA territory (series 3, image 14), but superimposed on chronic appearing inferior left occipital and posterior temporal lobe encephalomalacia. There is asymmetric patchy hypodensity in the left thalamus. Superimposed confluent bilateral widespread cerebral white matter hypodensity. Small foci of age indeterminate hypodensity  in the right caudate. The other deep gray matter nuclei, and the brainstem gray-white matter differentiation is within normal limits. Age indeterminate patchy hypodensity in the central left cerebellum. The right cerebellum appears within normal limits. No other cytotoxic edema identified. No acute intracranial hemorrhage identified. No midline shift, mass  effect, or evidence of intracranial mass lesion. No ventriculomegaly. Vascular: Calcified atherosclerosis at the skull base. No suspicious intracranial vascular hyperdensity. Skull: No acute osseous abnormality identified. Sinuses/Orbits: Mild ethmoid sinus mucosal thickening, otherwise clear. Other: No acute orbit or scalp soft tissue finding identified. ASPECTS [facility] Stroke Program Early CT Score) Total score (0-10 with 10 being normal): 10, although the left PCA and left cerebellar artery territories are abnormal. IMPRESSION: 1. Subacute on chronic appearing infarct in the left PCA territory, including evidence of left thalamic involvement. 2. Superimposed age indeterminate ischemia in the left cerebellum. 3. No associated acute intracranial hemorrhage or mass effect. 4. No other cytotoxic edema identified.  ASPECTS is 10. 5. Advanced nonspecific cerebral white matter disease. 6. These results were communicated to Dr. Amada Jupiter at 11:40 amon 2/8/2019by text page via the [facility] messaging system. Electronically Signed   By: Odessa Fleming M.D.   On: 05/14/2017 11:40        Scheduled Meds: . aspirin  300 mg Rectal Daily   Continuous Infusions:   LOS: 2 days    Time spent:    Zannie Cove, MD [facility] Page via [email or web address], password [email or web address] After 7PM please contact night-coverage  05/16/2017, 11:19 AM

## 2017-05-16 NOTE — NC FL2 (Signed)
  [place] MEDICAID FL2 LEVEL OF CARE SCREENING TOOL     IDENTIFICATION  Patient Name: Sharon Ryan Birthdate: 05/01/1918 Sex: female Admission Date (Current Location): 05/14/2017  [facility] [place]   Facility and Address:  [facility], [address], [place], [place] [address]      Provider Number: [id number]  Attending Physician Name and Address:  Zannie CoveJoseph, Preetha, MD  Relative Name and Phone Number:       Current Level of Care: SNF Recommended Level of Care: Skilled Nursing Facility Prior Approval Number:    Date Approved/Denied:   PASRR Number: [id number]  Discharge Plan: SNF    Current Diagnoses: Patient Active Problem List   Diagnosis Date Noted  . CVA (cerebral vascular accident) (HCC) 05/14/2017  . Closed fracture of left proximal tibia 01/30/2017  . AKI (acute kidney injury) (HCC) 01/30/2017  . Normal anion gap metabolic acidosis 01/30/2017  . Tibial fracture 01/30/2017    Orientation RESPIRATION BLADDER Height & Weight     (dissorriented x4)  O2(Venturi mask, 2L) Incontinent Weight: 116 lb 2.9 oz (52.7 kg) Height:  5\' 2"  (157.5 cm)  BEHAVIORAL SYMPTOMS/MOOD NEUROLOGICAL BOWEL NUTRITION STATUS      Incontinent (Please see d/c summary)  AMBULATORY STATUS COMMUNICATION OF NEEDS Skin   Total Care Does not communicate Normal                       Personal Care Assistance Level of Assistance  Bathing, Feeding, Dressing Bathing Assistance: Maximum assistance Feeding assistance: Limited assistance Dressing Assistance: Maximum assistance     Functional Limitations Info  Sight, Hearing, Speech Sight Info: Adequate Hearing Info: Impaired Speech Info: Impaired([place])    SPECIAL CARE FACTORS FREQUENCY                       Contractures Contractures Info: Not present    Additional Factors Info  Code Status, Allergies Code Status Info: DNR Allergies Info: NO known allergies            Current Medications (05/16/2017):  This is the current hospital active medication list Current Facility-Administered Medications  Medication Dose Route Frequency Provider Last Rate Last Dose  . acetaminophen (TYLENOL) tablet 650 mg  650 mg Oral Q4H PRN Pieter Partridgehatterjee, Srobona Tublu, MD       Or  . acetaminophen (TYLENOL) solution 650 mg  650 mg Per Tube Q4H PRN Pieter Partridgehatterjee, Srobona Tublu, MD       Or  . acetaminophen (TYLENOL) suppository 650 mg  650 mg Rectal Q4H PRN Leandro Reasonerhatterjee, Srobona Tublu, MD      . aspirin suppository 300 mg  300 mg Rectal Daily Leandro Reasonerhatterjee, Srobona Tublu, MD   300 mg at 05/16/17 [id number]     Discharge Medications: Please see discharge summary for a list of discharge medications.  Relevant Imaging Results:  Relevant Lab Results:   Additional Information SSN: [id number]  [name], LCSW

## 2017-05-16 NOTE — Care Management Note (Addendum)
Case Management Note  Patient Details  Name: Valeta HarmsCharlotte E Foisy MRN: [id number] Date of Birth: 03/17/1919  Subjective/Objective: Family member shared that he had talked with the folks at [facility] and they "have Hospice nurses there" Family would like her to return to [facility], where she has been a resident and has been "well cared for". I have LM for Delice Bisonara, CSW to return my call so that I can update her Spoke with CSW, Bridgette and made her aware of disposition                 Action/Plan:CM will sign off for now but will be available should additional discharge needs arise or disposition change.    Expected Discharge Date:  05/19/17               Expected Discharge Plan:     In-House Referral:     Discharge planning Services     Post Acute Care Choice:    Choice offered to:     DME Arranged:    DME Agency:     HH Arranged:    HH Agency:     Status of Service:     If discussed at [employer] of [employer] Meetings, dates discussed:    Additional Comments:  Yvone NeuCrutchfield, Makynzee Tigges M, RN 05/16/2017, 2:22 PM

## 2017-05-16 NOTE — Progress Notes (Signed)
STROKE TEAM PROGRESS NOTE   HISTORY OF PRESENT ILLNESS (per record) Sharon Ryan is an 84 y.o. female brought by EMS as code stroke from a nursing home.  She was last seen normal at 8:00 last night.  Patient was not seen until 930 this morning.  When seen she was noted to be aphasic, moaning, right facial droop, right sided weakness.  Patient was immediately brought to [facility] via EMS.  In route she is noted to be in new atrial flutter.  Initial CT scan did not show any blood.  CT was followed by CTA of head and neck.  No large vessel occlusion in the M1 or M2 was noted.  Unfortunately patient was not a TPA candidate as she was out of the window.  And she was not an IR candidate due to only large vessel occluded being in the territory of the already visible stroke.  Date last known well: Date: 05/13/2017 Time last known well: Time: 20:00 tPA Given: No: out of window NIH stroke scale 23  Modified Rankin: Rankin Score=3   SUBJECTIVE (INTERVAL HISTORY) No family members present. The patient is unable to communicate and poorly responsive. Family is agreeable to hospice and palliative care   OBJECTIVE Temp:  [97.7 F (36.5 C)-97.9 F (36.6 C)] 97.7 F (36.5 C) (02/10 1030) Pulse Rate:  [66-95] 95 (02/10 1030) Cardiac Rhythm: Atrial fibrillation (02/10 0830) Resp:  [14-20] 20 (02/10 1030) BP: (140-180)/(77-94) 140/82 (02/10 1030) SpO2:  [95 %-100 %] 97 % (02/10 1030) FiO2 (%):  [28 %] 28 % (02/10 0330)  CBC:  Recent Labs  Lab 05/14/17 1118 05/14/17 1124  WBC 5.6  --   NEUTROABS 3.4  --   HGB 9.5* 10.2*  HCT 30.4* 30.0*  MCV 86.4  --   PLT 180  --     Basic Metabolic Panel:  Recent Labs  Lab 05/14/17 1118 05/14/17 1124  NA 136 136  K 4.3 4.3  CL 105 103  CO2 19*  --   GLUCOSE 128* 125*  BUN 38* 33*  CREATININE 1.80* 1.80*  CALCIUM 9.2  --     Lipid Panel:     Component Value Date/Time   CHOL 175 05/15/2017 0335   TRIG 90 05/15/2017 0335   HDL  45 05/15/2017 0335   CHOLHDL 3.9 05/15/2017 0335   VLDL 18 05/15/2017 0335   LDLCALC 112 (H) 05/15/2017 0335   HgbA1c:  Lab Results  Component Value Date   HGBA1C 6.4 (H) 05/15/2017   Urine Drug Screen: No results found for: LABOPIA, COCAINSCRNUR, LABBENZ, AMPHETMU, THCU, LABBARB  Alcohol Level No results found for: [facility]  IMAGING  Mr Brain Wo Contrast 05/15/2017 IMPRESSION:  1. Acute/subacute left PCA territory infarct involving the medial and inferior left temporal lobe, the posterior hippocampus, the left occipital lobe, and the left thalamus.  2. Remote lacunar infarcts of the right basal ganglia and cerebellum, left greater than right.  3. Atrophy and diffuse white matter disease is advanced, even for age. This likely reflects the sequela of chronic microvascular ischemia.   Ct Angio Head W Or Wo Contrast Ct Angio Neck W Or Wo Contrast 05/14/2017 IMPRESSION:  1. Positive for occlusion of the Left PCA, including occlusion of the distal Left Pcomm.  2. Positive also for moderate to severe irregularity and stenosis at the origin of the dominant posterior Left MCA M2 branch, but it is unclear whether this is related to chronic atherosclerosis or thromboembolic disease.  3. No  other large vessel occlusion, but there is moderate to severe stenosis of the Basilar Artery distal 3rd, and there is moderate stenosis of the right ICA siphon due to calcified plaque.  4. Atherosclerosis of the aortic arch, cervical carotid and vertebral arteries, but no significant stenosis in the neck.  5. Cardiomegaly.  Calcified coronary artery atherosclerosis.     Dg Chest Port 1 View 05/14/2017 IMPRESSION:  Limited study. Lungs appear clear. No evidence of pneumonia or pulmonary edema. Probable cardiomegaly.     Ct Head Code Stroke Wo Contrast 05/14/2017 IMPRESSION:  1. Subacute on chronic appearing infarct in the left PCA territory, including evidence of left thalamic involvement.  2. Superimposed age  indeterminate ischemia in the left cerebellum.  3. No associated acute intracranial hemorrhage or mass effect.  4. No other cytotoxic edema identified.  ASPECTS is 10.  5. Advanced nonspecific cerebral white matter disease.     Transthoracic Echocardiogram - pending 04/14/2017     PHYSICAL EXAM Vitals:   05/16/17 0300 05/16/17 0302 05/16/17 0618 05/16/17 1030  BP:  (!) 173/85 (!) 180/94 140/82  Pulse: 72  86 95  Resp:  14 14 20   Temp: 97.8 F (36.6 C)  97.9 F (36.6 C) 97.7 F (36.5 C)  TempSrc: Axillary  Axillary Axillary  SpO2:  96% 95% 97%  Weight:      Height:       Frail elderly Caucasian lady not in distress. . Afebrile. Head is nontraumatic. Neck is supple without bruit.    Cardiac exam no murmur or gallop. Lungs are clear to auscultation. Distal pulses are well felt.  Neurological Exam :  obtunded stuporose. Can be aroused to open eyes. Left gaze preference. Will not look to the right past midline. Will follow only occasional midline commands like sticking out her tongue and moving her left side purposefully. Pupils irregular but reactive. Blinks to threat on the left but not the right. Fundi not visualized. Right lower facial weakness. Tongue midline. Dense right hemiplegia trace withdrawal to pain. Purposeful antigravity movement in the left approximately. Withdraws in the left lower extremity to pain.       ASSESSMENT/PLAN [name] is a 84 y.o. female with history of hypertension, osteoarthritis, recent tibial fracture, and newly diagnosed atrial flutter presenting with aphasia and right-sided weakness. She did not receive IV t-PA due to late presentation.  Stroke:  Left PCA infarct likely embolic secondary to atrial flutter.  Resultant   global aphasia and right hemiplegia  CT head -  Subacute on chronic appearing infarct in the left PCA territory, including evidence of left thalamic involvement. Superimposed age indeterminate ischemia in the  left cerebellum.   MRI head - left PCA territory infarcts with remote lacunar infarcts.  MRA head - not performed  CTA H&N - Positive for occlusion of the Left PCA, including occlusion of the distal Left Pcomm. Moderate Right ICA stenosis.  Carotid Doppler - CTA neck  2D Echo - pending  LDL - 112  HgbA1c - 6.4  VTE prophylaxis - SCDs Diet NPO time specified Fall precautions Aspiration precautions  No antithrombotic prior to admission, now on aspirin 300 mg suppository daily  Ongoing aggressive stroke risk factor management  Therapy recommendations:  SNF  Disposition:  Pending  Hypertension  Stable  Permissive hypertension (OK if < 220/120) but gradually normalize in 5-7 days  Long-term BP goal normotensive  Hyperlipidemia  Home meds: No lipid lowering medications prior to admission.  LDL 112, goal <  70  Add low-dose statin when PO access is available.  Continue statin at discharge   Other Stroke Risk Factors  Advanced age  Hx stroke/TIA - by imaging  Newly diagnosed atrial flutter   Other Active Problems  Anemia  Renal disease   Plan / Recommendations   Stroke workup - await echocardiogram  Family is considering hospice placement  Delton See PA-C [facility] Pager [phone] 05/16/2017, 12:59 PM  Hospital day # 2  She has presented with a large left MCA and PCA infarct likely of cardioembolic etiology from her new onset atrial flutter but given her poor baseline her prognosis is extremely poor and she is unlikely this will survive this without requiring feeding tube possible life support and prolonged nursing home stay. The patient's son is quite clear that she would not want that. He wants to give her a few days to decide if she gets any better. He agrees to DO NOT RESUSCITATE and not to escalate her care. Recheck speech therapy swallow eval and if she is unable to swallow in the next few days he is agreeable to  considering comfort care in the future. Long discussion with the patient's son as well as with Dr. Jomarie Longs and answered questions.  Agree with hospice and palliative care and transferred to residential hospice if family agreeable. Stroke team will sign off. Kindly call for questions.  To contact Stroke Continuity provider, please refer to [email or web address]. After hours, contact General Neurology

## 2017-05-16 NOTE — Progress Notes (Signed)
SLP Cancellation Note  Patient Details Name: Sharon Ryan MRN: [id number] DOB: 01/04/1919   Cancelled treatment:       Reason Eval/Treat Not Completed: Fatigue/lethargy limiting ability to participate X 2.  Patient was barely arousable to noxious stimuli and when presented with an ice chip to her lips no attempts to take material were seen.  Recommend keeping the patient NPO and ST will follow up next date for PO readiness pending information regarding goals of care.    Dimas AguasMelissa Grayling Schranz, MA, CCC-SLP Acute Rehab SLP [phone]  Sharon Ryan 05/16/2017, 1:23 PM

## 2017-05-16 NOTE — Clinical Social Work Note (Signed)
Clinical Social Work Assessment  Patient Details  Name: Sharon HarmsCharlotte E Ryan MRN: [id number] Date of Birth: 07/22/1918  Date of referral:  05/16/17               Reason for consult:  Facility Placement                Permission sought to share information with:  Family Supports Permission granted to share information::     Name::     Research scientist (medical)Bodo  Agency::     Relationship::  Son  SolicitorContact Information:     Housing/Transportation Living arrangements for the past 2 months:  [facility] Facility Source of Information:  Adult Children Patient Interpreter Needed:  None Criminal Activity/Legal Involvement Pertinent to Current Situation/Hospitalization:  No - Comment as needed Significant Relationships:  Adult Children Lives with:  Facility Resident Do you feel safe going back to the place where you live?  Yes Need for family participation in patient care:     Care giving concerns:  Pt is disorriented x4 as well as mute. Per FYI pt has a legal guardian however we have no documents scanned into media stating that. Only contact we have is son. CSW spoke with son via telephone.   Social Worker assessment / plan:  Per son [facility]. Son visited facility today to discuss pt's worsening and possibly pt returning at d/c. [facility] told the son the facility has hospice and would allow the pt to return at d/c. CSW will follow up with facility.   Employment status:  Retired Health and safety inspectornsurance information:  Medicare PT Recommendations:  Skilled Nursing Facility Information / Referral to community resources:  Skilled Nursing Facility  Patient/Family's Response to care:  Pt's son verbalized understanding of CSW role and expressed appreciation for support. Pt's son denies any concern regarding pt care at this time.  Patient/Family's Understanding of and Emotional Response to Diagnosis, Current Treatment, and Prognosis:  Pt's son understanding and realistic regarding pt's physical limitations. Pt's son  understands the need for pt to return to SNF at d/c. Pt's son agreeable to pt returning to facility at d/c. Pt's son denies any concern regarding treatment plan at this time. CSW will continue to provide support and facilitate d/c needs.   Emotional Assessment Appearance:  Appears stated age Attitude/Demeanor/Rapport:  Unable to Assess Affect (typically observed):  Unable to Assess Orientation:  (Disorriented x4) Alcohol / Substance use:  Not Applicable Psych involvement (Current and /or in the community):  No (Comment)  Discharge Needs  Concerns to be addressed:  Basic Needs, Care Coordination Readmission within the last 30 days:  No Current discharge risk:  Dependent with Mobility Barriers to Discharge:  Continued Medical Work up   Pacific MutualBridget A Iva Montelongo, LCSW 05/16/2017, 3:54 PM

## 2017-05-16 NOTE — Progress Notes (Signed)
SLP Cancellation Note  Patient Details Name: Valeta HarmsCharlotte E Hogle MRN: [id number] DOB: 03/15/1919   Cancelled treatment:       Reason Eval/Treat Not Completed: Fatigue/lethargy limiting ability to participate most likely due to administration of ativan for a procedure last night.   MD requesting that we follow up around noon to see if the patient is more alert.    Dimas AguasMelissa Jurnie Garritano, MA, CCC-SLP Acute Rehab SLP [phone] Fleet ContrasMelissa N Kristoph Sattler 05/16/2017, 7:52 AM

## 2017-05-17 LAB — ECHOCARDIOGRAM COMPLETE
HEIGHTINCHES: 62 in
WEIGHTICAEL: 1858.92 [oz_av]

## 2017-05-17 MED ORDER — COLLAGENASE 250 UNIT/GM EX OINT
TOPICAL_OINTMENT | Freq: Every day | CUTANEOUS | Status: DC
  Administered 2017-05-17: 13:00:00 via TOPICAL
  Filled 2017-05-17: qty 30

## 2017-05-17 MED ORDER — MORPHINE SULFATE (CONCENTRATE) 10 MG /0.5 ML PO SOLN: 5.0000 mg | ORAL | 0 refills | Status: AC | PRN

## 2017-05-17 NOTE — Progress Notes (Signed)
Report called into [facility]. Report given to ElmsfordKristin. Patient's family has been updated.

## 2017-05-17 NOTE — Consult Note (Signed)
WOC Nurse wound consult note Reason for Consult: Consult requested for bilat heels; appearance is consistent with chronic pressure injuries.  Left heel unstageable pressure injury .3X.2X.2cm, dry yellow wound bed, no odor or drainage. Right heel with unstageable pressure injury; 4X2cm, 50% eschar, 50% dark purple deep tissue injury, small amt tan drainage, no odor Pressure Injury POA: Yes Dressing procedure/placement/frequency: Foat heels to reduce pressure, Santyl for enzymatic debridement of nonviable tissue. No family present to discuss plan of care. Please re-consult if further assistance is needed.  Thank-you,  Cammie Mcgeeawn Jerrine Urschel MSN, RN, CWOCN, OvettWCN-AP, CNS [phone]

## 2017-05-17 NOTE — Discharge Summary (Signed)
Physician Discharge Summary  Sharon HarmsCharlotte E Warrior VWU:[id number] DOB: 02/08/1919 DOA: 05/14/2017  PCP: Ralene OkMoreira, Roy, MD  Admit date: 05/14/2017 Discharge date: 05/17/2017  Time spent: 45 minutes  Recommendations for Outpatient Follow-up:  1. Discharge to SNF versus residential hospice for end-of-life comfort care   Discharge Diagnoses:  Active Problems:   CVA (cerebral vascular accident) (HCC)   Multiple embolic strokes   Aphasia   Dysphagia   Hypoxia   Adult failure to thrive   Atrial flutter  Discharge Condition:  Poor  Diet recommendation: Comfort feeds as tolerated  Filed Weights   05/14/17 1214  Weight: 52.7 kg (116 lb 2.9 oz)    History of present illness:  Sharon MerryCharlotte E Paradyis an 84 y.o.femalewho is admitted for new onset right-sided weakness and new onset A. fib aphasia with altered mental status. She was living independently until October 2018 when she had a mechanical fall and sustained a tibial fracture. She was admitted here and was discharged to nursing home/rehabilitation facility, she had been doing poorly and going downhill with ongoing failure to thrive.At the nursing home and was noted to have some clumsiness of her right hand for a week and aphasia, sent to the emergency room for evaluation  Hospital Course:   Multiple acute strokes  - MRI with acute CVA involving medial and inferior left temporal lobe, the posterior hippocampus, the left occipital lobe, and the left thalamus. Remote lacunar infarcts of the right basal ganglia and cerebellum, left greater than right and advanced atrophy -Remains obtunded/minimally responsive,unable to have swallow eval  -Called and updated son, recommended comfort focused care and residential hospice -Son is agreeable to this, Child psychotherapistsocial worker consulted -She will be discharged back to SNF for comfort/end-of-life care  Non displaced rightproximal tibia fracture -diagnosed in October following a fall -NWB RLE recommended  then and Knee Immobilizer -Now comfort care   Atrial FLutter -Now comfort care   Hypertension -Comfort Care  Mild AKI on chronic kidney disease stage III  -Stopped fluids, comfort measures only  CODE STATUS:DO NOT RESUSCITATE     Consultations:  Neurology  Discharge Exam: Vitals:   05/17/17 0204 05/17/17 0600  BP: 127/88 101/82  Pulse: 98 99  Resp: 20   Temp: 97.7 F (36.5 C) 97.6 F (36.4 C)  SpO2: 97% 93%    General: Minimally responsive, withdraws to painful stimuli Cardiovascular: S1-S2/tachycardic Respiratory: Few scattered rhonchi and conducted upper airway sounds  Discharge Instructions   Discharge Instructions    Bed rest   Complete by:  As directed    Discharge instructions   Complete by:  As directed    Comfort Care/comfort feeds     Allergies as of 05/17/2017   No Known Allergies     Medication List    STOP taking these medications   amLODipine 5 MG tablet Commonly known as:  NORVASC   chlorthalidone 25 MG tablet Commonly known as:  HYGROTON   co-enzyme Q-10 30 MG capsule   folic acid 800 MCG tablet Commonly known as:  FOLVITE   metoprolol succinate 50 MG 24 hr tablet Commonly known as:  TOPROL-XL   NUTRITIONAL SUPPLEMENT PO   OVER THE COUNTER MEDICATION   polyethylene glycol packet Commonly known as:  MIRALAX / GLYCOLAX   ramipril 5 MG capsule Commonly known as:  ALTACE   SUPER B COMPLEX/C Caps   traMADol 50 MG tablet Commonly known as:  ULTRAM   ULTRA OMEGA 3 PO   Vitamin D3 5000 units Caps  TAKE these medications   acetaminophen 500 MG tablet Commonly known as:  TYLENOL Take 500 mg by mouth every 6 (six) hours as needed.   morphine CONCENTRATE 10 mg / 0.5 ml concentrated solution Place 0.25 mLs (5 mg total) under the tongue every 4 (four) hours as needed for moderate pain or shortness of breath.      No Known Allergies    The results of significant diagnostics from this hospitalization  (including imaging, microbiology, ancillary and laboratory) are listed below for reference.    Significant Diagnostic Studies: Ct Angio Head W Or Wo Contrast  Result Date: 05/14/2017 CLINICAL DATA:  84 year old female code stroke. Right facial droop and right side weakness. Last seen normal 2000 hr yesterday. EXAM: CT ANGIOGRAPHY HEAD AND NECK TECHNIQUE: Multidetector CT imaging of the head and neck was performed using the standard protocol during bolus administration of intravenous contrast. Multiplanar CT image reconstructions and MIPs were obtained to evaluate the vascular anatomy. Carotid stenosis measurements (when applicable) are obtained utilizing NASCET criteria, using the distal internal carotid diameter as the denominator. CONTRAST:  50 milliliters Isovue 370 COMPARISON:  Noncontrast head CT 1126 hr today. FINDINGS: CTA NECK Skeleton: Thoracic kyphoscoliosis. Cervical spine degeneration. Absent dentition. No acute osseous abnormality identified. Upper chest: Cardiomegaly. Calcified coronary artery atherosclerosis. Negative visible lung parenchyma. No superior mediastinal lymphadenopathy. Other neck: Negative. Small thyroid nodules which do not meet consensus criteria for thyroid ultrasound follow-up. No cervical lymphadenopathy. Aortic arch: Moderate soft and calcified plaque throughout the aortic arch and visible descending thoracic aorta. Calcified coronary artery atherosclerosis. 3 vessel arch configuration. Right carotid system: No brachiocephalic or right CCA origin stenosis despite mild plaque. Tortuous proximal right CCA. Soft and calcified plaque at the right carotid bifurcation and right ICA origin with less than 50 % stenosis with respect to the distal vessel of the proximal right ICA. Mildly tortuous mid cervical right ICA. Left carotid system: No left CCA origin stenosis. Tortuous proximal left CCA. Calcified plaque at the left carotid bifurcation with more bulky lateral left ICA origin and  bulb mixed soft and calcified plaque. Still, less than 50 % stenosis with respect to the distal vessel. Tortuous mid cervical left ICA. Vertebral arteries: . Patent no proximal right subclavian right vertebral artery origin artery stenosis without stenosis. Tortuous right V1 segment. Patent right vertebral artery to the skull base without focal stenosis. No proximal left subclavian artery stenosis despite plaque. Normal left vertebral artery origin. Tortuous left V1 segment. Irregularity of the left vertebral artery V2 segment but the vessel remains patent to the skull base without focal stenosis. CTA HEAD Posterior circulation: Patent distal vertebral arteries, the left is diminutive beyond the left PICA origin. The right PICA origin is patent. No distal vertebral artery stenosis. Patent vertebrobasilar junction. Diminutive basilar artery. There is moderate to severe irregularity in the distal 3rd of the basilar with short-segment moderate to severe stenosis of the vessel (series 9, image 106 and series 13, image 24. still, the distal basilar is patent. SCA origins are patent. There are fetal type PCA origins. The left posterior communicating artery is occluded just before the junction with the left P1 segment, which is also occluded. The left P2 and P3 are largely occluded with minimal reconstituted enhancement (series 12, image 20). There is mild to moderate widespread irregularity in the right PCA and its branches which remain patent. Anterior circulation: Both ICA siphons are patent. There is moderate left and severe right ICA siphon calcified plaque. No left siphon  stenosis. Left ophthalmic and posterior communicating artery origins are patent. On the right side there is mild to moderate distal cavernous and proximal supraclinoid ICA stenosis due to calcified plaque. Normal right ophthalmic and posterior communicating artery origins. Patent carotid termini. Patent MCA and ACA origins with only mild  irregularity (series 13, image 20). The anterior communicating artery and bilateral ACA branches are within normal limits. Left MCA M1 segment and left MCA bifurcation are patent. The more anterior left M2 branch appears normal. There is moderate to severe irregularity and stenosis affecting the proximal few millimeters of the dominant posterior left M2 branch best seen on series 13, image 21. No left MCA branch occlusion is identified. Right MCA M1 segment is tortuous and patent. Right MCA bifurcation is patent. Right MCA branches are mildly to moderately irregular. No right MCA branch occlusion identified. Venous sinuses: Not evaluated due to early contrast timing. Anatomic variants: Fetal type bilateral PCA origins. Review of the MIP images confirms the above findings IMPRESSION: 1. Positive for occlusion of the Left PCA, including occlusion of the distal Left Pcomm. 2. Positive also for moderate to severe irregularity and stenosis at the origin of the dominant posterior Left MCA M2 branch, but it is unclear whether this is related to chronic atherosclerosis or thromboembolic disease. 3. No other large vessel occlusion, but there is moderate to severe stenosis of the Basilar Artery distal 3rd, and there is moderate stenosis of the right ICA siphon due to calcified plaque. 4. Atherosclerosis of the aortic arch, cervical carotid and vertebral arteries, but no significant stenosis in the neck. 5. Cardiomegaly.  Calcified coronary artery atherosclerosis. Salient findings were communicated to Dr. Amada Jupiter at 12:20 pmon 2/8/2019by text page via the [facility] messaging system. Electronically Signed   By: Odessa Fleming M.D.   On: 05/14/2017 12:21   Ct Angio Neck W Or Wo Contrast  Result Date: 05/14/2017 CLINICAL DATA:  84 year old female code stroke. Right facial droop and right side weakness. Last seen normal 2000 hr yesterday. EXAM: CT ANGIOGRAPHY HEAD AND NECK TECHNIQUE: Multidetector CT imaging of the head and neck was  performed using the standard protocol during bolus administration of intravenous contrast. Multiplanar CT image reconstructions and MIPs were obtained to evaluate the vascular anatomy. Carotid stenosis measurements (when applicable) are obtained utilizing NASCET criteria, using the distal internal carotid diameter as the denominator. CONTRAST:  50 milliliters Isovue 370 COMPARISON:  Noncontrast head CT 1126 hr today. FINDINGS: CTA NECK Skeleton: Thoracic kyphoscoliosis. Cervical spine degeneration. Absent dentition. No acute osseous abnormality identified. Upper chest: Cardiomegaly. Calcified coronary artery atherosclerosis. Negative visible lung parenchyma. No superior mediastinal lymphadenopathy. Other neck: Negative. Small thyroid nodules which do not meet consensus criteria for thyroid ultrasound follow-up. No cervical lymphadenopathy. Aortic arch: Moderate soft and calcified plaque throughout the aortic arch and visible descending thoracic aorta. Calcified coronary artery atherosclerosis. 3 vessel arch configuration. Right carotid system: No brachiocephalic or right CCA origin stenosis despite mild plaque. Tortuous proximal right CCA. Soft and calcified plaque at the right carotid bifurcation and right ICA origin with less than 50 % stenosis with respect to the distal vessel of the proximal right ICA. Mildly tortuous mid cervical right ICA. Left carotid system: No left CCA origin stenosis. Tortuous proximal left CCA. Calcified plaque at the left carotid bifurcation with more bulky lateral left ICA origin and bulb mixed soft and calcified plaque. Still, less than 50 % stenosis with respect to the distal vessel. Tortuous mid cervical left ICA. Vertebral arteries: . Patent  no proximal right subclavian right vertebral artery origin artery stenosis without stenosis. Tortuous right V1 segment. Patent right vertebral artery to the skull base without focal stenosis. No proximal left subclavian artery stenosis despite  plaque. Normal left vertebral artery origin. Tortuous left V1 segment. Irregularity of the left vertebral artery V2 segment but the vessel remains patent to the skull base without focal stenosis. CTA HEAD Posterior circulation: Patent distal vertebral arteries, the left is diminutive beyond the left PICA origin. The right PICA origin is patent. No distal vertebral artery stenosis. Patent vertebrobasilar junction. Diminutive basilar artery. There is moderate to severe irregularity in the distal 3rd of the basilar with short-segment moderate to severe stenosis of the vessel (series 9, image 106 and series 13, image 24. still, the distal basilar is patent. SCA origins are patent. There are fetal type PCA origins. The left posterior communicating artery is occluded just before the junction with the left P1 segment, which is also occluded. The left P2 and P3 are largely occluded with minimal reconstituted enhancement (series 12, image 20). There is mild to moderate widespread irregularity in the right PCA and its branches which remain patent. Anterior circulation: Both ICA siphons are patent. There is moderate left and severe right ICA siphon calcified plaque. No left siphon stenosis. Left ophthalmic and posterior communicating artery origins are patent. On the right side there is mild to moderate distal cavernous and proximal supraclinoid ICA stenosis due to calcified plaque. Normal right ophthalmic and posterior communicating artery origins. Patent carotid termini. Patent MCA and ACA origins with only mild irregularity (series 13, image 20). The anterior communicating artery and bilateral ACA branches are within normal limits. Left MCA M1 segment and left MCA bifurcation are patent. The more anterior left M2 branch appears normal. There is moderate to severe irregularity and stenosis affecting the proximal few millimeters of the dominant posterior left M2 branch best seen on series 13, image 21. No left MCA branch  occlusion is identified. Right MCA M1 segment is tortuous and patent. Right MCA bifurcation is patent. Right MCA branches are mildly to moderately irregular. No right MCA branch occlusion identified. Venous sinuses: Not evaluated due to early contrast timing. Anatomic variants: Fetal type bilateral PCA origins. Review of the MIP images confirms the above findings IMPRESSION: 1. Positive for occlusion of the Left PCA, including occlusion of the distal Left Pcomm. 2. Positive also for moderate to severe irregularity and stenosis at the origin of the dominant posterior Left MCA M2 branch, but it is unclear whether this is related to chronic atherosclerosis or thromboembolic disease. 3. No other large vessel occlusion, but there is moderate to severe stenosis of the Basilar Artery distal 3rd, and there is moderate stenosis of the right ICA siphon due to calcified plaque. 4. Atherosclerosis of the aortic arch, cervical carotid and vertebral arteries, but no significant stenosis in the neck. 5. Cardiomegaly.  Calcified coronary artery atherosclerosis. Salient findings were communicated to Dr. Amada Jupiter at 12:20 pmon 2/8/2019by text page via the [facility] messaging system. Electronically Signed   By: Odessa Fleming M.D.   On: 05/14/2017 12:21   Mr Brain Wo Contrast  Result Date: 05/15/2017 CLINICAL DATA:  Ataxia.  Altered level of consciousness. EXAM: MRI HEAD WITHOUT CONTRAST TECHNIQUE: Multiplanar, multiecho pulse sequences of the brain and surrounding structures were obtained without intravenous contrast. COMPARISON:  CTA of the head and neck 05/14/2016. FINDINGS: Brain: Diffusion-weighted images demonstrate acute/subacute nonhemorrhagic infarcts involving the inferomedial posterior left temporal lobe, posterior hippocampus, the left  occipital pole, and the left thalamus. No significant right-sided infarct or anterior circulation infarct is present. No acute cerebellar infarct is present. Advanced atrophy and diffuse white  matter disease is present bilaterally. Remote lacunar infarcts are present in the anterior right basal ganglia. Remote lacunar infarcts are present in the cerebellum, left greater than right. Vascular: Abnormal signal in the left posterior cerebral artery is compatible with known occlusion. Flow is present in the basilar artery and both vertebral arteries. Flow is present in the anterior circulation. Skull and upper cervical spine: The skull base is within normal limits. The craniocervical junction is normal. Exaggerated cervical lordosis is present. Marrow signal is normal. Midline sagittal structures are otherwise unremarkable. Sinuses/Orbits: The paranasal sinuses and mastoid air cells are clear. Bilateral lens replacements are present. Globes and orbits are otherwise within normal limits. IMPRESSION: 1. Acute/subacute left PCA territory infarct involving the medial and inferior left temporal lobe, the posterior hippocampus, the left occipital lobe, and the left thalamus. 2. Remote lacunar infarcts of the right basal ganglia and cerebellum, left greater than right. 3. Atrophy and diffuse white matter disease is advanced, even for age. This likely reflects the sequela of chronic microvascular ischemia. Electronically Signed   By: Marin Roberts M.D.   On: 05/15/2017 19:28   Dg Chest Port 1 View  Result Date: 05/14/2017 CLINICAL DATA:  Stroke affecting right side. Hx of hypertension. EXAM: PORTABLE CHEST 1 VIEW COMPARISON:  None. FINDINGS: Patient unable to be properly positioned limiting characterization. Grossly, lungs appear clear. Probable cardiomegaly. No pleural effusion or pneumothorax seen. IMPRESSION: Limited study. Lungs appear clear. No evidence of pneumonia or pulmonary edema. Probable cardiomegaly. Electronically Signed   By: Bary Richard M.D.   On: 05/14/2017 13:53   Ct Head Code Stroke Wo Contrast  Result Date: 05/14/2017 CLINICAL DATA:  Code stroke. 84 year old female with right facial  droop and right side weakness. Last seen normal 2000 hr yesterday. EXAM: CT HEAD WITHOUT CONTRAST TECHNIQUE: Contiguous axial images were obtained from the base of the skull through the vertex without intravenous contrast. COMPARISON:  None. FINDINGS: Study is intermittently degraded by motion artifact. Brain: Patchy hypodensity compatible with cytotoxic edema in the left PCA territory (series 3, image 14), but superimposed on chronic appearing inferior left occipital and posterior temporal lobe encephalomalacia. There is asymmetric patchy hypodensity in the left thalamus. Superimposed confluent bilateral widespread cerebral white matter hypodensity. Small foci of age indeterminate hypodensity in the right caudate. The other deep gray matter nuclei, and the brainstem gray-white matter differentiation is within normal limits. Age indeterminate patchy hypodensity in the central left cerebellum. The right cerebellum appears within normal limits. No other cytotoxic edema identified. No acute intracranial hemorrhage identified. No midline shift, mass effect, or evidence of intracranial mass lesion. No ventriculomegaly. Vascular: Calcified atherosclerosis at the skull base. No suspicious intracranial vascular hyperdensity. Skull: No acute osseous abnormality identified. Sinuses/Orbits: Mild ethmoid sinus mucosal thickening, otherwise clear. Other: No acute orbit or scalp soft tissue finding identified. ASPECTS [facility] Stroke Program Early CT Score) Total score (0-10 with 10 being normal): 10, although the left PCA and left cerebellar artery territories are abnormal. IMPRESSION: 1. Subacute on chronic appearing infarct in the left PCA territory, including evidence of left thalamic involvement. 2. Superimposed age indeterminate ischemia in the left cerebellum. 3. No associated acute intracranial hemorrhage or mass effect. 4. No other cytotoxic edema identified.  ASPECTS is 10. 5. Advanced nonspecific cerebral white matter  disease. 6. These results were communicated to Dr.  Kirkpatrick at 11:40 amon 2/8/2019by text page via the [facility] messaging system. Electronically Signed   By: Odessa Fleming M.D.   On: 05/14/2017 11:40    Microbiology: Recent Results (from the past 240 hour(s))  MRSA PCR Screening     Status: None   Collection Time: 05/14/17  8:50 PM  Result Value Ref Range Status   MRSA by PCR NEGATIVE NEGATIVE Final    Comment:        The GeneXpert MRSA Assay (FDA approved for NASAL specimens only), is one component of a comprehensive MRSA colonization surveillance program. It is not intended to diagnose MRSA infection nor to guide or monitor treatment for MRSA infections. Performed at [facility], [address] [place], [place] [address]      Labs: Basic Metabolic Panel: Recent Labs  Lab 05/14/17 1118 05/14/17 1124  NA 136 136  K 4.3 4.3  CL 105 103  CO2 19*  --   GLUCOSE 128* 125*  BUN 38* 33*  CREATININE 1.80* 1.80*  CALCIUM 9.2  --    Liver Function Tests: Recent Labs  Lab 05/14/17 1118  AST 24  ALT 14  ALKPHOS 85  BILITOT 0.8  PROT 6.6  ALBUMIN 3.4*   No results for input(s): LIPASE, AMYLASE in the last 168 hours. No results for input(s): AMMONIA in the last 168 hours. CBC: Recent Labs  Lab 05/14/17 1118 05/14/17 1124  WBC 5.6  --   NEUTROABS 3.4  --   HGB 9.5* 10.2*  HCT 30.4* 30.0*  MCV 86.4  --   PLT 180  --    Cardiac Enzymes: No results for input(s): CKTOTAL, CKMB, CKMBINDEX, TROPONINI in the last 168 hours. BNP: BNP (last 3 results) No results for input(s): BNP in the last 8760 hours.  ProBNP (last 3 results) No results for input(s): PROBNP in the last 8760 hours.  CBG: Recent Labs  Lab 05/14/17 1120  GLUCAP 116*       Signed:  Zannie Cove MD.  [facility] 05/17/2017, 10:56 AM

## 2017-05-17 NOTE — Progress Notes (Signed)
Discharge to: [facility] Anticipated discharge date: 05/17/17 Family notified: Yes, at bedside Transportation by: [facility]  Report #: [id number], Room 5012-A  CSW signing off.  Blenda Nicelylizabeth Deanndra Kirley LCSW [phone]

## 2017-05-17 NOTE — Progress Notes (Signed)
SLP Cancellation Note  Patient Details Name: Sharon Ryan MRValeta Ryan: [id number] DOB: 08/31/1918   Cancelled treatment:       Reason Eval/Treat Not Completed: Other (comment)- Pt for D/C to SNF with hospice services.  SLP will respectfully sign off.   Blenda MountsCouture, Kaithlyn Teagle Laurice 05/17/2017, 1:19 PM

## 2017-05-17 NOTE — Care Management Note (Signed)
Case Management Note  Patient Details  Name: Sharon Ryan MRN: [id number] Date of Birth: 03/31/1919  Subjective/Objective:                    Action/Plan: Pt discharging to [facility] with hospice services. No further needs per CM.   Expected Discharge Date:  05/17/17               Expected Discharge Plan:  Skilled Nursing Facility  In-House Referral:  Clinical Social Work  Discharge planning Services  CM Consult  Post Acute Care Choice:    Choice offered to:     DME Arranged:    DME Agency:  Other - Comment  HH Arranged:    HH Agency:     Status of Service:  Completed, signed off  If discussed at [employer] Length of Stay Meetings, dates discussed:    Additional Comments:  Kermit BaloKelli F [name], RN 05/17/2017, 12:49 PM

## 2017-06-04 DEATH — deceased

## 2019-09-06 IMAGING — CT CT HEAD CODE STROKE
4 series · 16 of 47 positions shown, 18 images · non-contrast
Comparison: None.

CLINICAL DATA: Code stroke. [AGE] female with right facial
droop and right side weakness. Last seen normal 6555 hr yesterday.

EXAM:
CT HEAD WITHOUT CONTRAST
TECHNIQUE: Contiguous axial images were obtained from the base of the skull
through the vertex without intravenous contrast.

[Series 3: head wo · axial · 0.47mm/px · z∈[-148,-28]mm · 7 of 34 slices shown, 9 images]
[im 5/34  brain]
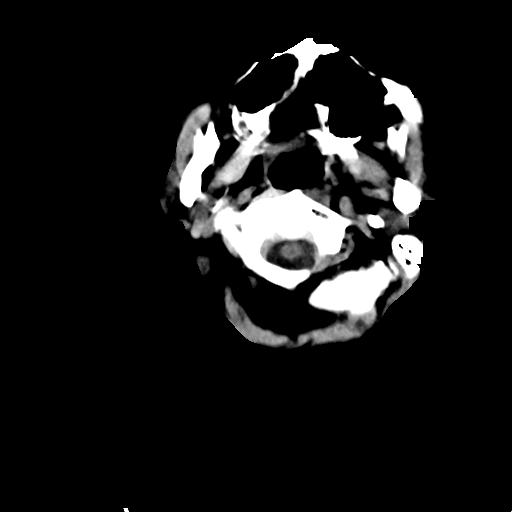
[im 5/34  bone]
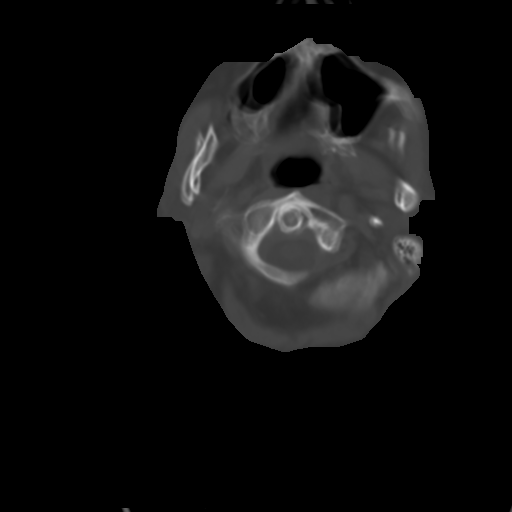
[im 9/34  brain]
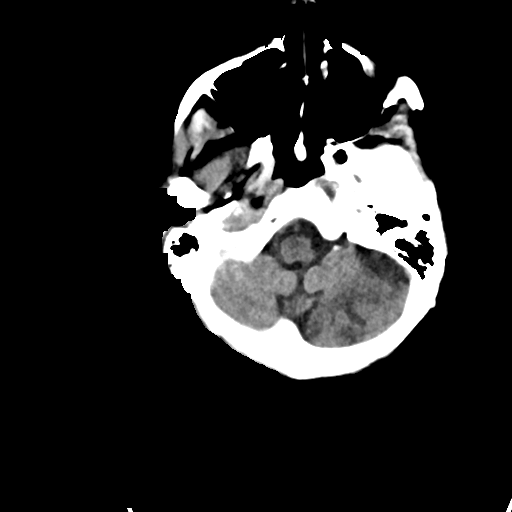
[im 13/34  brain]
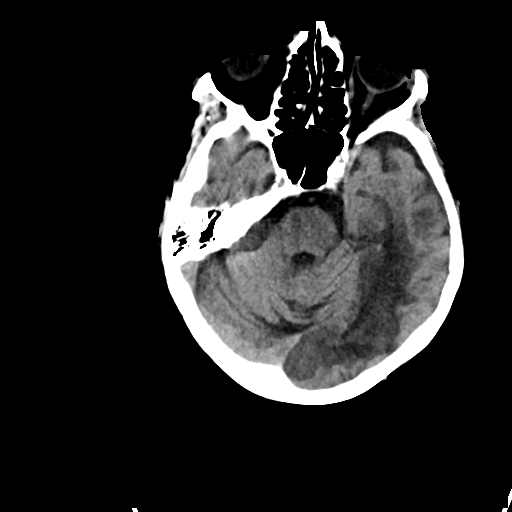
[im 17/34  brain]
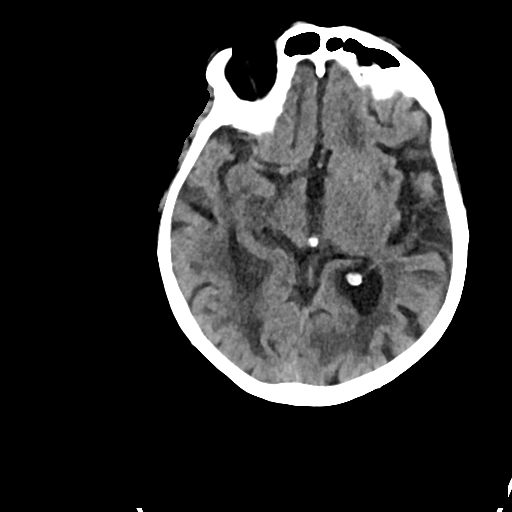
[im 21/34  brain]
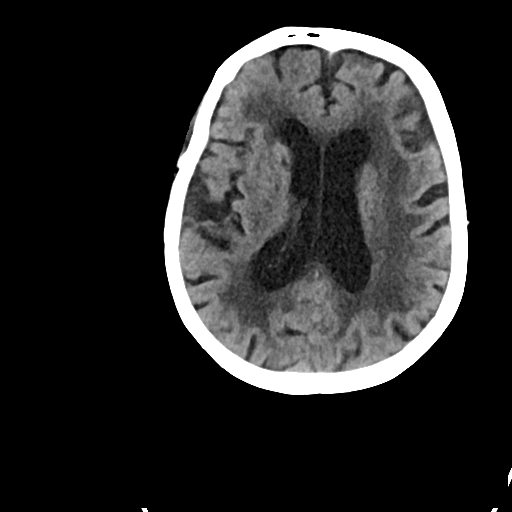
[im 21/34  bone]
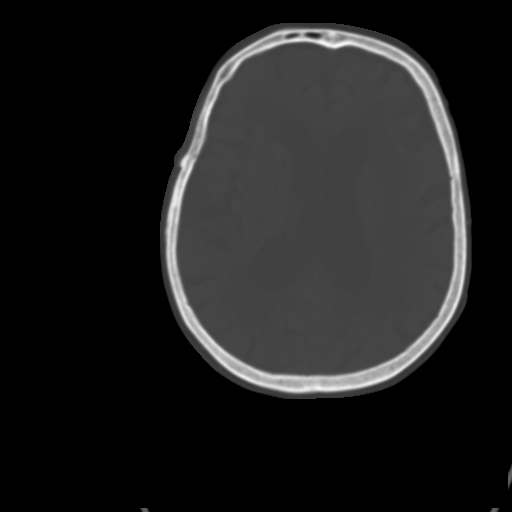
[im 25/34  brain]
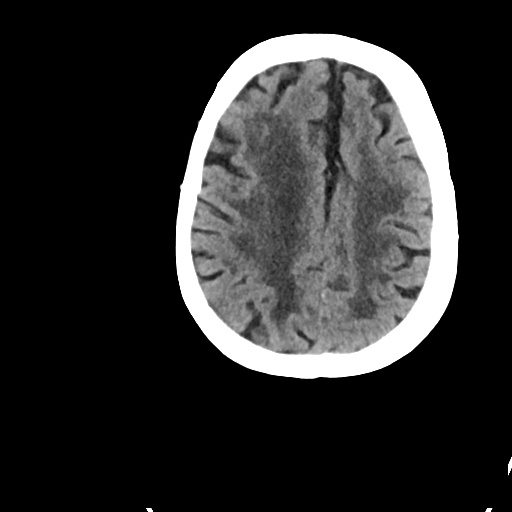
[im 29/34  brain]
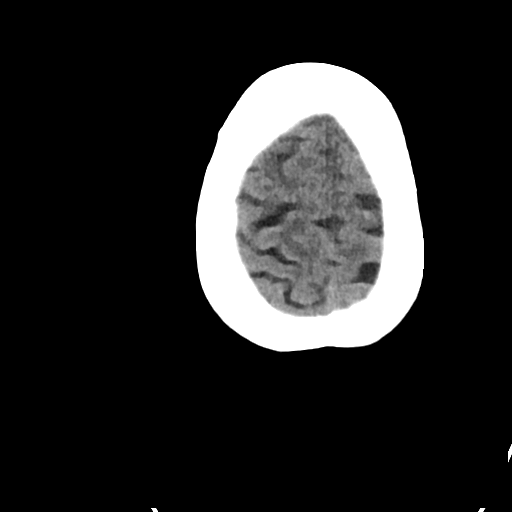

[Series 4: head bone · axial · 0.47mm/px · z∈[-152,-118]mm · 3 of 85 slices shown]
[im 9/85  bone]
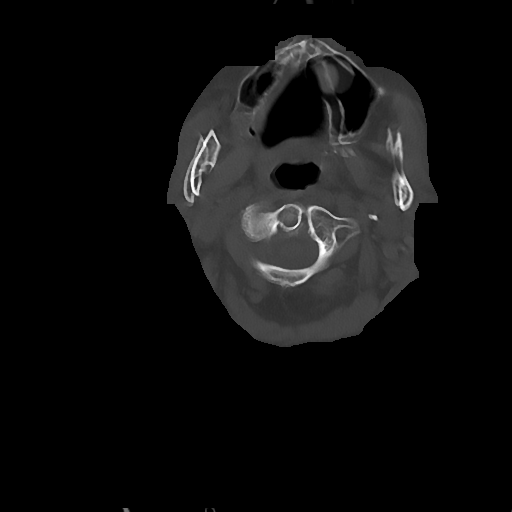
[im 17/85  bone]
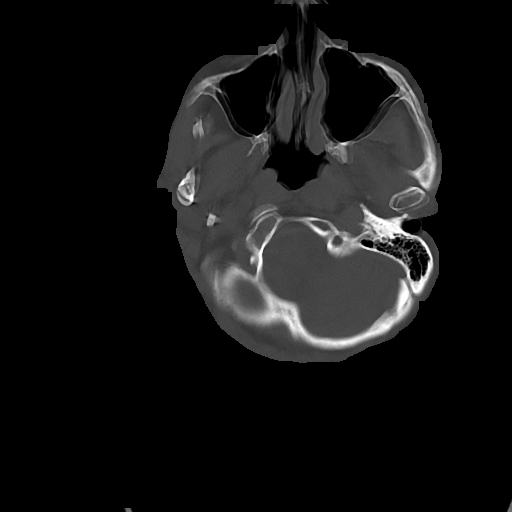
[im 26/85  bone]
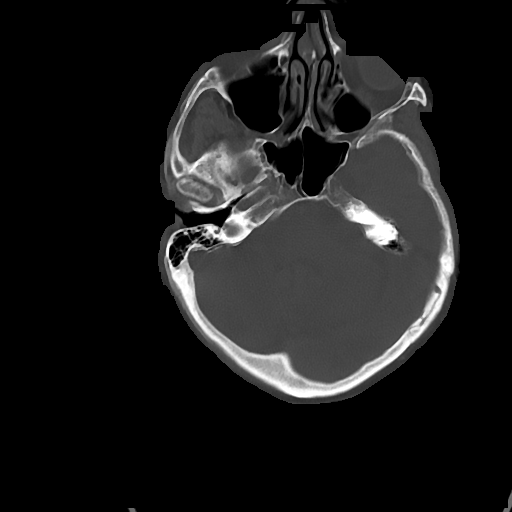

[Series 5: cor soft · coronal · 0.33mm/px · 3 of 67 slices shown]
[im 23/67  brain]
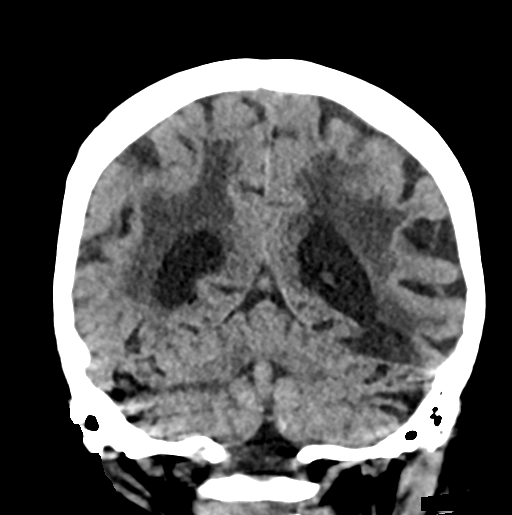
[im 30/67  brain]
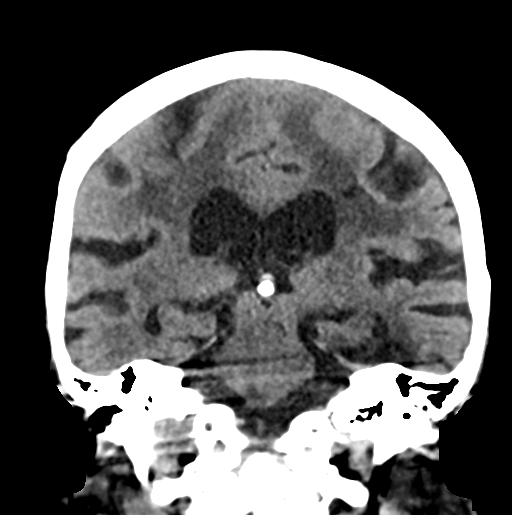
[im 37/67  brain]
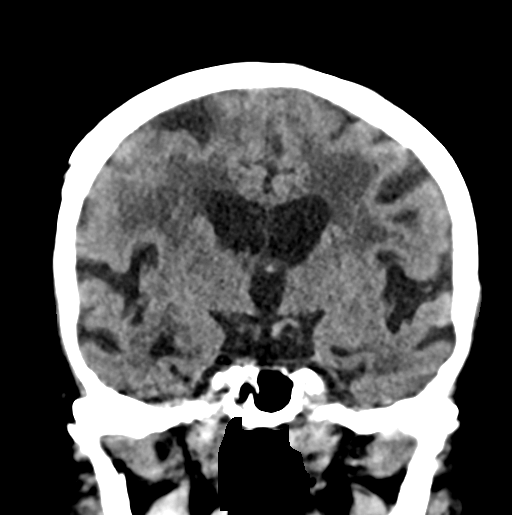

[Series 6: sag soft · sagittal · 0.33mm/px · 3 of 54 slices shown]
[im 21/54  brain]
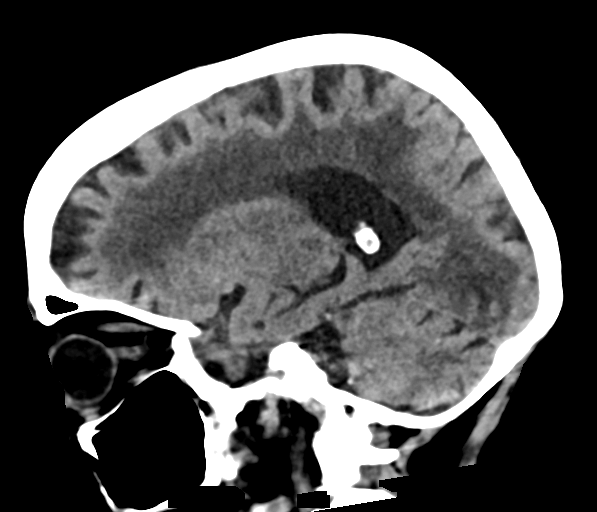
[im 27/54  brain]
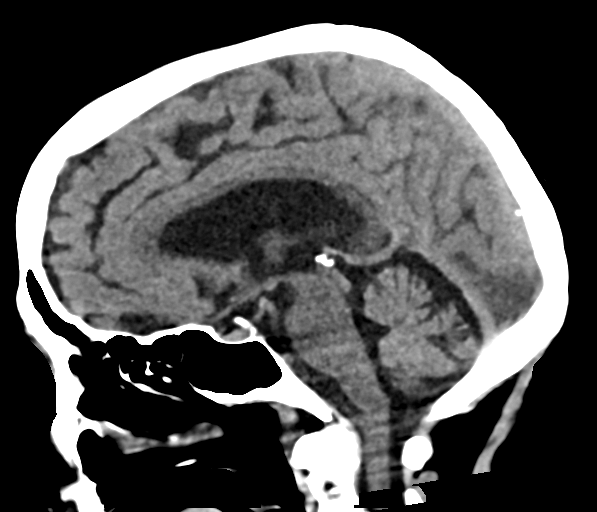
[im 33/54  brain]
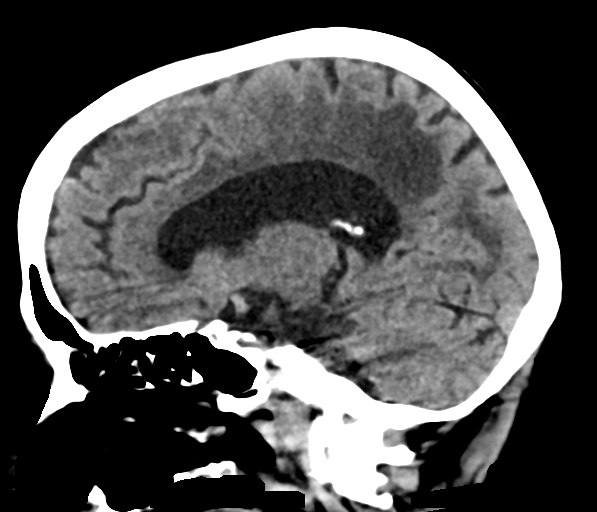

[16 of 47 positions shown; findings below may reference images not displayed]

FINDINGS: Study is intermittently degraded by motion artifact.

Brain: Patchy hypodensity compatible with cytotoxic edema in the
left PCA territory (series 3, image 14), but superimposed on chronic
appearing inferior left occipital and posterior temporal lobe
encephalomalacia. There is asymmetric patchy hypodensity in the left
thalamus.

Superimposed confluent bilateral widespread cerebral white matter
hypodensity. Small foci of age indeterminate hypodensity in the
right caudate. The other deep gray matter nuclei, and the brainstem
gray-white matter differentiation is within normal limits.

Age indeterminate patchy hypodensity in the central left cerebellum.
The right cerebellum appears within normal limits.

No other cytotoxic edema identified. No acute intracranial
hemorrhage identified. No midline shift, mass effect, or evidence of
intracranial mass lesion. No ventriculomegaly.

Vascular: Calcified atherosclerosis at the skull base. No suspicious
intracranial vascular hyperdensity.

Skull: No acute osseous abnormality identified.

Sinuses/Orbits: Mild ethmoid sinus mucosal thickening, otherwise
clear.

Other: No acute orbit or scalp soft tissue finding identified.

ASPECTS ([place] Stroke Program Early CT Score)

Total score (0-10 with 10 being normal): 10, although the left PCA
and left cerebellar artery territories are abnormal.
IMPRESSION: 1. Subacute on chronic appearing infarct in the left PCA territory,
including evidence of left thalamic involvement.
2. Superimposed age indeterminate ischemia in the left cerebellum.
3. No associated acute intracranial hemorrhage or mass effect.
4. No other cytotoxic edema identified.  ASPECTS is 10.
5. Advanced nonspecific cerebral white matter disease.
6. These results were communicated to Dr. Lipe at [DATE] [place]
05/14/2017by text page via the [employer] messaging system.
